# Patient Record
Sex: Female | Born: 1950 | Race: Black or African American | Hispanic: No | Smoking: Never smoker
Health system: Southern US, Community
[De-identification: ages and names within clinical notes are randomized; demographics above are authoritative.]

## PROBLEM LIST (undated history)

## (undated) DIAGNOSIS — E7800 Pure hypercholesterolemia, unspecified: Secondary | ICD-10-CM

## (undated) DIAGNOSIS — R011 Cardiac murmur, unspecified: Secondary | ICD-10-CM

## (undated) DIAGNOSIS — C439 Malignant melanoma of skin, unspecified: Secondary | ICD-10-CM

## (undated) DIAGNOSIS — Z9889 Other specified postprocedural states: Secondary | ICD-10-CM

## (undated) DIAGNOSIS — R112 Nausea with vomiting, unspecified: Secondary | ICD-10-CM

## (undated) DIAGNOSIS — I8290 Acute embolism and thrombosis of unspecified vein: Secondary | ICD-10-CM

## (undated) DIAGNOSIS — C50912 Malignant neoplasm of unspecified site of left female breast: Secondary | ICD-10-CM

## (undated) DIAGNOSIS — I10 Essential (primary) hypertension: Secondary | ICD-10-CM

## (undated) DIAGNOSIS — Z889 Allergy status to unspecified drugs, medicaments and biological substances status: Secondary | ICD-10-CM

## (undated) HISTORY — PX: MELANOMA EXCISION: SHX5266

## (undated) HISTORY — DX: Allergy status to unspecified drugs, medicaments and biological substances: Z88.9

## (undated) HISTORY — DX: Essential (primary) hypertension: I10

## (undated) HISTORY — DX: Acute embolism and thrombosis of unspecified vein: I82.90

## (undated) HISTORY — PX: PORTA CATH INSERTION: CATH118285

## (undated) HISTORY — PX: BUNIONECTOMY: SHX129

## (undated) HISTORY — PX: TUBAL LIGATION: SHX77

## (undated) HISTORY — PX: BREAST CYST EXCISION: SHX579

---

## 2003-04-20 DIAGNOSIS — I059 Rheumatic mitral valve disease, unspecified: Secondary | ICD-10-CM | POA: Insufficient documentation

## 2004-02-16 DIAGNOSIS — S22009A Unspecified fracture of unspecified thoracic vertebra, initial encounter for closed fracture: Secondary | ICD-10-CM | POA: Insufficient documentation

## 2005-06-09 ENCOUNTER — Encounter: Payer: Self-pay | Admitting: Hematology

## 2005-06-11 DIAGNOSIS — N6019 Diffuse cystic mastopathy of unspecified breast: Secondary | ICD-10-CM | POA: Insufficient documentation

## 2008-09-07 DIAGNOSIS — E559 Vitamin D deficiency, unspecified: Secondary | ICD-10-CM | POA: Insufficient documentation

## 2009-07-21 ENCOUNTER — Encounter: Payer: Self-pay | Admitting: Hematology

## 2009-09-08 DIAGNOSIS — Z803 Family history of malignant neoplasm of breast: Secondary | ICD-10-CM | POA: Insufficient documentation

## 2010-08-17 ENCOUNTER — Encounter: Payer: Self-pay | Admitting: Hematology

## 2012-09-03 DIAGNOSIS — Z7189 Other specified counseling: Secondary | ICD-10-CM | POA: Insufficient documentation

## 2013-07-28 ENCOUNTER — Encounter: Payer: Self-pay | Admitting: Hematology

## 2013-07-29 DIAGNOSIS — D122 Benign neoplasm of ascending colon: Secondary | ICD-10-CM | POA: Insufficient documentation

## 2013-07-29 DIAGNOSIS — K648 Other hemorrhoids: Secondary | ICD-10-CM | POA: Insufficient documentation

## 2013-08-15 DIAGNOSIS — R7303 Prediabetes: Secondary | ICD-10-CM | POA: Insufficient documentation

## 2015-02-08 DIAGNOSIS — D539 Nutritional anemia, unspecified: Secondary | ICD-10-CM | POA: Insufficient documentation

## 2016-01-25 DIAGNOSIS — R739 Hyperglycemia, unspecified: Secondary | ICD-10-CM | POA: Insufficient documentation

## 2017-09-11 DIAGNOSIS — C50912 Malignant neoplasm of unspecified site of left female breast: Secondary | ICD-10-CM

## 2017-09-11 HISTORY — DX: Malignant neoplasm of unspecified site of left female breast: C50.912

## 2017-09-11 HISTORY — PX: BREAST BIOPSY: SHX20

## 2017-09-26 ENCOUNTER — Encounter: Payer: Self-pay | Admitting: Hematology

## 2017-09-28 DIAGNOSIS — C50419 Malignant neoplasm of upper-outer quadrant of unspecified female breast: Secondary | ICD-10-CM | POA: Insufficient documentation

## 2017-10-11 ENCOUNTER — Encounter: Payer: Self-pay | Admitting: Hematology

## 2017-10-18 ENCOUNTER — Encounter: Payer: Self-pay | Admitting: Hematology

## 2017-10-29 ENCOUNTER — Encounter: Payer: Self-pay | Admitting: Hematology

## 2017-11-07 ENCOUNTER — Encounter: Payer: Self-pay | Admitting: Hematology

## 2017-11-20 ENCOUNTER — Other Ambulatory Visit (HOSPITAL_COMMUNITY): Payer: Self-pay | Admitting: Hematology

## 2017-11-23 ENCOUNTER — Other Ambulatory Visit (HOSPITAL_COMMUNITY): Payer: Self-pay | Admitting: Pharmacist

## 2017-11-25 DIAGNOSIS — R768 Other specified abnormal immunological findings in serum: Secondary | ICD-10-CM | POA: Insufficient documentation

## 2017-11-28 ENCOUNTER — Other Ambulatory Visit (HOSPITAL_COMMUNITY): Payer: Self-pay | Admitting: Adult Health

## 2017-11-28 DIAGNOSIS — C50412 Malignant neoplasm of upper-outer quadrant of left female breast: Secondary | ICD-10-CM | POA: Insufficient documentation

## 2017-11-28 DIAGNOSIS — Z171 Estrogen receptor negative status [ER-]: Principal | ICD-10-CM

## 2017-11-28 NOTE — Progress Notes (Deleted)
START ON PATHWAY REGIMEN - Breast     A cycle is every 14 days (cycles 1-4):     Doxorubicin      Cyclophosphamide      Pegfilgrastim-xxxx    A cycle is every 21 days (cycles 5-8):     Paclitaxel      Carboplatin   **Always confirm dose/schedule in your pharmacy ordering system**    Patient Characteristics: Preoperative or Nonsurgical Candidate (Clinical Staging), Neoadjuvant Therapy followed by Surgery, Invasive Disease, Chemotherapy, HER2 Negative/Unknown/Equivocal, ER Negative/Unknown, Platinum Therapy Indicated Therapeutic Status: Preoperative or Nonsurgical Candidate (Clinical Staging) AJCC M Category: cM0 AJCC Grade: G3 Breast Surgical Plan: Neoadjuvant Therapy followed by Surgery ER Status: Negative (-) AJCC 8 Stage Grouping: IIIC HER2 Status: Negative (-) AJCC T Category: cT1c AJCC N Category: cN2 PR Status: Negative (-) Type of Therapy: Platinum Therapy Indicated Intent of Therapy: Curative Intent, Discussed with Patient

## 2017-11-29 ENCOUNTER — Other Ambulatory Visit (HOSPITAL_COMMUNITY): Payer: Self-pay | Admitting: Pharmacist

## 2017-11-29 ENCOUNTER — Encounter (HOSPITAL_COMMUNITY): Payer: Self-pay | Admitting: Emergency Medicine

## 2017-11-29 NOTE — Patient Instructions (Signed)
[facility]   CHEMOTHERAPY INSTRUCTIONS   You will have the following premedications prior to receiving chemotherapy: Premeds: Benadryl:  Help prevent a reaction to the chemotherapy.  Pepcid: antihistamine premed Aloxi - high powered nausea/vomiting prevention medication used for chemotherapy patients.  Dexamethasone - steroid - given to reduce the risk of you having an allergic type reaction to the chemotherapy. Dex can cause you to feel energized, nervous/anxious/jittery, make you have trouble sleeping, and/or make you feel hot/flushed in the face/neck and/or look pink/red in the face/neck. These side effects will pass as the Dex wears off. (takes 20 minutes to infuse)   POTENTIAL SIDE EFFECTS OF TREATMENT:  Carboplatin (Generic Name) Other Names: Paraplatin, CBDCA  About This Drug Carboplatin is a drug used to treat cancer. This drug is given in the vein (IV). This drug takes about 30 minutes to infuse.   Possible Side Effects (More Common) . Nausea and throwing up (vomiting). These symptoms may happen within a few hours after your treatment and may last up to 24 hours. Medicines are available to stop or lessen these side effects. . Bone marrow depression. This is a decrease in the number of white blood cells, red blood cells, and platelets. This may raise your risk of infection, make you tired and weak (fatigue), and raise your risk of bleeding. . Soreness of the mouth and throat. You may have red areas, white patches, or sores that hurt. . This drug may affect how your kidneys work. Your kidney function will be checked as needed. . Electrolyte changes. Your blood will be checked for electrolyte changes as needed.  Side Effects (Less Common) . Hair loss. Some patients lose their hair on the scalp and body. You may notice your hair thinning seven to 14 days after getting this drug. . Effects on the nerves are called peripheral neuropathy. You may feel  numbness, tingling, or pain in your hands and feet. It may be hard for you to button your clothes, open jars, or walk as usual. The effect on the nerves may get worse with more doses of the drug. These effects get better in some people after the drug is stopped but it does not get better in all people. . Loose bowel movements (diarrhea) that may last for several days . Decreased hearing or ringing in the ears . Changes in the way food and drinks taste . Changes in liver function. Your liver function will be checked as needed.  Allergic Reactions Serious allergic reactions including anaphylaxis are rare. While you are getting this drug in your vein (IV), tell your nurse right away if you have any of these symptoms of an allergic reaction: . Trouble catching your breath . Feeling like your tongue or throat are swelling . Feeling your heart beat quickly or in a not normal way (palpitations) . Feeling dizzy or lightheaded . Flushing, itching, rash, and/or hives  Treating Side Effects . Drink 6-8 cups of fluids each day unless your doctor has told you to limit your fluid intake due to some other health problem. A cup is 8 ounces of fluid. If you throw up or have loose bowel movements, you should drink more fluids so that you do not become dehydrated (lack water in the body from losing too much fluid). . Mouth care is very important. Your mouth care should consist of routine, gentle cleaning of your teeth or dentures and rinsing your mouth with a mixture of 1/2 teaspoon of salt  in 8 ounces of water or  teaspoon of baking soda in 8 ounces of water. This should be done at least after each meal and at bedtime. . If you have mouth sores, avoid mouthwash that has alcohol. Avoid alcohol and smoking because they can bother your mouth and throat. . If you have numbness and tingling in your hands and feet, be careful when cooking, walking, and handling sharp objects and hot liquids. . Talk with your nurse about  getting a wig before you lose your hair. Also, call the [facility] at 800-ACS-2345 to find out information about the "Look Good, Feel Better" program close to where you live. It is a free program where women getting chemotherapy can learn about wigs, turbans and scarves as well as makeup techniques and skin and nail care.  Food and Drug Interactions There are no known interactions of carboplatin with food. This drug may interact with other medicines. Tell your doctor and pharmacist about all the medicines and dietary supplements (vitamins, minerals, herbs and others) that you are taking at this time. The safety and use of dietary supplements and alternative diets are often not known. Using these might affect your cancer or interfere with your treatment. Until more is known, you should not use dietary supplements or alternative diets without your doctor's help.  When to Call the Doctor Call your doctor or nurse right away if you have any of these symptoms: . Fever of 100.5 F (38 C) or above; chills . Bleeding or bruising that is not normal . Wheezing or trouble breathing . Nausea that stops you from eating or drinking . Throwing up more than once a day . Rash or itching . Loose bowel movements (diarrhea) more than four times a day or diarrhea with weakness or feeling lightheaded . Call your doctor or nurse as soon as possible if any of these symptoms happen: . Numbness, tingling, decreased feeling or weakness in fingers, toes, arms, or legs . Change in hearing, ringing in the ears . Blurred vision or other changes in eyesight . Decreased urine . Yellowing of skin or eyes  Problems and Reproductive Concerns Sexual problems and reproduction concerns may happen. In both men and women, this drug may affect your ability to have children. This cannot be determined before your treatment. Talk with your doctor or nurse if you plan to have children. Ask for information on sperm or egg  banking. In men, this drug may interfere with your ability to make sperm, but it should not change your ability to have sexual relations. In women, menstrual bleeding may become irregular or stop while you are getting this drug. Do not assume that you cannot become pregnant if you do not have a menstrual period. Women may go through signs of menopause (change of life) like vaginal dryness or itching. Vaginal lubricants can be used to lessen vaginal dryness, itching, and pain during sexual relations. Genetic counseling is available for you to talk about the effects of this drug therapy on future pregnancies. Also, a genetic counselor can look at the possible risk of problems in the unborn baby due to this medicine if an exposure happens during pregnancy. . Pregnancy warning: This drug may have harmful effects on the unborn child, so effective methods of birth control should be used during your cancer treatment. . Breast feeding warning: It is not known if this drug passes into breast milk. For this reason, women should talk to their doctor about the risks and benefits  of breast feeding during treatment with this drug because this drug may enter the breast milk and badly harm a breast feeding baby.   Paclitaxel (Taxol)  Paclitaxel is a drug used to treat cancer. It is given in the vein (IV).  This will take 3 hours to infuse.  The first time this drug is given it will take longer to infuse because it is increased slowly to monitor for reactions.  The nurse will be in the room with you for the first 15 minutes.   Possible Side Effects . Hair loss. Hair loss is often temporary, although with certain medicine, hair loss can sometimes be permanent. Hair loss may happen suddenly or gradually. If you lose hair, you may lose it from your head, face, armpits, pubic area, chest, and/or legs. You may also notice your hair getting thin. . Swelling of your legs, ankles and/or feet (edema) . Flushing . Nausea and  throwing up (vomiting) . Loose bowel movements (diarrhea) . Bone marrow depression. This is a decrease in the number of white blood cells, red blood cells, and platelets. This may raise your risk of infection, make you tired and weak (fatigue), and raise your risk of bleeding. . Effects on the nerves are called peripheral neuropathy. You may feel numbness, tingling, or pain in your hands and feet. It may be hard for you to button your clothes, open jars, or walk as usual. The effect on the nerves may get worse with more doses of the drug. These effects get better in some people after the drug is stopped but it does not get better in all people. . Changes in your liver function . Bone, joint and muscle pain . Abnormal EKG . Allergic reaction: Allergic reactions, including anaphylaxis are rare but may happen in some patients. Signs of allergic reaction to this drug may be swelling of the face, feeling like your tongue or throat are swelling, trouble breathing, rash, itching, fever, chills, feeling dizzy, and/or feeling that your heart is beating in a fast or not normal way. If this happens, do not take another dose of this drug. You should get urgent medical treatment. . Infection . Changes in your kidney function. Note: Each of the side effects above was reported in 20% or greater of patients treated with paclitaxel. Not all possible side effects are included above.  Warnings and Precautions . Severe bone marrow depression   Side Effects . To help with hair loss, wash with a mild shampoo and avoid washing your hair every day. . Avoid rubbing your scalp, instead, pat your hair or scalp dry . Avoid coloring your hair . Limit your use of hair spray, electric curlers, blow dryers, and curling irons. . If you are interested in getting a wig, talk to your nurse. You can also call the [facility] at 800-ACS-2345 to find out information about the "Look Good, Feel Better" program close to  where you live. It is a free program where women getting chemotherapy can learn about wigs, turbans and scarves as well as makeup techniques and skin and nail care. . Ask your doctor or nurse about medicines that are available to help stop or lessen diarrhea and/or nausea. . To help with nausea and vomiting, eat small, frequent meals instead of three large meals a day. Choose foods and drinks that are at room temperature. Ask your nurse or doctor about other helpful tips and medicine that is available to help or stop lessen these symptoms. . If  you get diarrhea, eat low-fiber foods that are high in protein and calories and avoid foods that can irritate your digestive tracts or lead to cramping. Ask your nurse or doctor about medicine that can lessen or stop your diarrhea. . Mouth care is very important. Your mouth care should consist of routine, gentle cleaning of your teeth or dentures and rinsing your mouth with a mixture of 1/2 teaspoon of salt in 8 ounces of water or  teaspoon of baking soda in 8 ounces of water. This should be done at least after each meal and at bedtime. . If you have mouth sores, avoid mouthwash that has alcohol. Also avoid alcohol and smoking because they can bother your mouth and throat. . Drink plenty of fluids (a minimum of eight glasses per day is recommended). . Take your temperature as your doctor or nurse tells you, and whenever you feel like you may have a fever. . Talk to your doctor or nurse about precautions you can take to avoid infections and bleeding. . Be careful when cooking, walking, and handling sharp objects and hot liquids.  Food and Drug Interactions . There are no known interactions of paclitaxel with food. . This drug may interact with other medicines. Tell your doctor and pharmacist about all the medicines and dietary supplements (vitamins, minerals, herbs and others) that you are taking at this time. . The safety and use of dietary supplements and  alternative diets are often not known. Using these might affect your cancer or interfere with your treatment. Until more is known, you should not use dietary supplements or alternative diets without your cancer doctor's help.  When to Call the Doctor Call your doctor or nurse if you have any of the following symptoms and/or any new or unusual symptoms: . Fever of 100.5 F (38 C) or above . Chills . Redness, pain, warmth, or swelling at the IV site during the infusion . Signs of allergic reaction: swelling of the face, feeling like your tongue or throat are swelling, trouble breathing, rash, itching, fever, chills, feeling dizzy, and/or feeling that your heart is beating in a fast or not normal way . Feeling that your heart is beating in a fast or not normal way (palpitations) . Weight gain of 5 pounds in one week (fluid retention) . Decreased urine or very dark urine . Signs of liver problems: dark urine, pale bowel movements, bad stomach pain, feeling very tired and weak, unusual itching, or yellowing of the eyes or skin . Heavy menstrual period that lasts longer than normal . Easy bruising or bleeding . Nausea that stops you from eating or drinking, and/or that is not relieved by prescribed medicines. . Loose bowel movements (diarrhea) more than 4 times a day or diarrhea with weakness or lightheadedness . Pain in your mouth or throat that makes it hard to eat or drink . Lasting loss of appetite or rapid weight loss of five pounds in a week . Signs of peripheral neuropathy: numbness, tingling, or decreased feeling in fingers or toes; trouble walking or changes in the way you walk; or feeling clumsy when buttoning clothes, opening jars, or other routine activities . Joint and muscle pain that is not relieved by prescribed medicines . Extreme fatigue that interferes with normal activities . While you are getting this drug, please tell your nurse right away if you have any pain, redness, or  swelling at the site of the IV infusion. . If you think you are pregnant.  Reproduction  Warnings . Pregnancy warning: This drug may have harmful effects on the unborn child, it is recommended that effective methods of birth control should be used during your cancer treatment. Let your doctor know right away if you think you may be pregnant. . Breast feeding warning: Women should not breast feed during treatment because this drug could enter the breast milk and cause harm to a breast feeding baby.     SELF CARE ACTIVITIES WHILE ON CHEMOTHERAPY: Hydration Increase your fluid intake 48 hours prior to treatment and drink at least 8 to 12 cups (64 ounces) of water/decaff beverages per day after treatment. You can still have your cup of coffee or soda but these beverages do not count as part of your 8 to 12 cups that you need to drink daily. No alcohol intake.  Medications Continue taking your normal prescription medication as prescribed.  If you start any new herbal or new supplements please let us know first to make sure it is safe.  Mouth Care Have teeth cleaned professionally before starting treatment. Keep dentures and partial plates clean. Use soft toothbrush and do not use mouthwashes that contain alcohol. Biotene is a good mouthwash that is available at most pharmacies or may be ordered by calling [phone]. Use warm salt water gargles (1 teaspoon salt per 1 quart warm water) before and after meals and at bedtime. Or you may rinse with 2 tablespoons of three-percent hydrogen peroxide mixed in eight ounces of water. If you are still having problems with your mouth or sores in your mouth please call the clinic. If you need dental work, please let the doctor know before you go for your appointment so that we can coordinate the best possible time for you in regards to your chemo regimen. You need to also let your dentist know that you are actively taking chemo. We may need to do labs prior to  your dental appointment.  Skin Care Always use sunscreen that has not expired and with SPF (Sun Protection Factor) of 50 or higher. Wear hats to protect your head from the sun. Remember to use sunscreen on your hands, ears, face, & feet.  Use good moisturizing lotions such as udder cream, eucerin, or even Vaseline. Some chemotherapies can cause dry skin, color changes in your skin and nails.    . Avoid long, hot showers or baths. . Use gentle, fragrance-free soaps and laundry detergent. . Use moisturizers, preferably creams or ointments rather than lotions because the thicker consistency is better at preventing skin dehydration. Apply the cream or ointment within 15 minutes of showering. Reapply moisturizer at night, and moisturize your hands every time after you wash them.  Hair Loss (if your doctor says your hair will fall out)  . If your doctor says that your hair is likely to fall out, decide before you begin chemo whether you want to wear a wig. You may want to shop before treatment to match your hair color. . Hats, turbans, and scarves can also camouflage hair loss, although some people prefer to leave their heads uncovered. If you go bare-headed outdoors, be sure to use sunscreen on your scalp. . Cut your hair short. It eases the inconvenience of shedding lots of hair, but it also can reduce the emotional impact of watching your hair fall out. . Don't perm or color your hair during chemotherapy. Those chemical treatments are already damaging to hair and can enhance hair loss. Once your chemo treatments are done and your hair  has grown back, it's OK to resume dyeing or perming hair. With chemotherapy, hair loss is almost always temporary. But when it grows back, it may be a different color or texture. In older adults who still had hair color before chemotherapy, the new growth may be completely gray.  Often, new hair is very fine and soft.  Infection Prevention Please wash your hands for at  least 30 seconds using warm soapy water. Handwashing is the #1 way to prevent the spread of germs. Stay away from sick people or people who are getting over a cold. If you develop respiratory systems such as green/yellow mucus production or productive cough or persistent cough let us know and we will see if you need an antibiotic. It is a good idea to keep a pair of gloves on when going into grocery stores/[employer] to decrease your risk of coming into contact with germs on the carts, etc. Carry alcohol hand gel with you at all times and use it frequently if out in public. If your temperature reaches 100.5 or higher please call the clinic and let us know.  If it is after hours or on the weekend please go to the ER if your temperature is over 100.5.  Please have your own personal thermometer at home to use.    Sex and bodily fluids If you are going to have sex, a condom must be used to protect the person that isn't taking chemotherapy. Chemo can decrease your libido (sex drive). For a few days after chemotherapy, chemotherapy can be excreted through your bodily fluids.  When using the toilet please close the lid and flush the toilet twice.  Do this for a few day after you have had chemotherapy.   Effects of chemotherapy on your sex life Some changes are simple and won't last long. They won't affect your sex life permanently. Sometimes you may feel: . too tired . not strong enough to be very active . sick or sore  . not in the mood . anxious or low Your anxiety might not seem related to sex. For example, you may be worried about the cancer and how your treatment is going. Or you may be worried about money, or about how you family are coping with your illness. These things can cause stress, which can affect your interest in sex. It's important to talk to your partner about how you feel. Remember - the changes to your sex life don't usually last long. There's usually no medical reason to stop having sex  during chemo. The drugs won't have any long term physical effects on your performance or enjoyment of sex. Cancer can't be passed on to your partner during sex  Contraception It's important to use reliable contraception during treatment. Avoid getting pregnant while you or your partner are having chemotherapy. This is because the drugs may harm the baby. Sometimes chemotherapy drugs can leave a man or woman infertile.  This means you would not be able to have children in the future. You might want to talk to someone about permanent infertility. It can be very difficult to learn that you may no longer be able to have children. Some people find counselling helpful. There might be ways to preserve your fertility, although this is easier for men than for women. You may want to speak to a fertility expert. You can talk about sperm banking or harvesting your eggs. You can also ask about other fertility options, such as donor eggs. If you have or  have had breast cancer, your doctor might advise you not to take the contraceptive pill. This is because the hormones in it might affect the cancer.  It is not known for sure whether or not chemotherapy drugs can be passed on through semen or secretions from the vagina. Because of this some doctors advise people to use a barrier method if you have sex during treatment. This applies to vaginal, anal or oral sex. Generally, doctors advise a barrier method only for the time you are actually having the treatment and for about a week after your treatment. Advice like this can be worrying, but this does not mean that you have to avoid being intimate with your partner. You can still have close contact with your partner and continue to enjoy sex.     Animals If you have cats or birds we just ask that you not change the litter or change the cage.  Please have someone else do this for you while you are on chemotherapy.   Food Safety During and After Cancer Treatment Food  safety is important for people both during and after cancer treatment. Cancer and cancer treatments, such as chemotherapy, radiation therapy, and stem cell/bone marrow transplantation, often weaken the immune system. This makes it harder for your body to protect itself from foodborne illness, also called food poisoning. Foodborne illness is caused by eating food that contains harmful bacteria, parasites, or viruses.  Foods to avoid Some foods have a higher risk of becoming tainted with bacteria. These include: [place] Kitchen Unwashed fresh fruit and vegetables, especially leafy vegetables that can hide dirt and other contaminants . Raw sprouts, such as alfalfa sprouts . Raw or undercooked beef, especially ground beef, or other raw or undercooked meat and poultry . Fatty, fried, or spicy foods immediately before or after treatment.  These can sit heavy on your stomach and make you feel nauseous. . Raw or undercooked shellfish, such as oysters. . Sushi and sashimi, which often contain raw fish.  . Unpasteurized beverages, such as unpasteurized fruit juices, raw milk, raw yogurt, or cider . Undercooked eggs, such as soft boiled, over easy, and poached; raw, unpasteurized eggs; or foods made with raw egg, such as homemade raw cookie dough and homemade mayonnaise Simple steps for food safety Shop smart. . Do not buy food stored or displayed in an unclean area. . Do not buy bruised or damaged fruits or vegetables. . Do not buy cans that have cracks, dents, or bulges. . Pick up foods that can spoil at the end of your shopping trip and store them in a cooler on the way home. Prepare and clean up foods carefully. . Rinse all fresh fruits and vegetables under running water, and dry them with a clean towel or paper towel. . Clean the top of cans before opening them. . After preparing food, wash your hands for 20 seconds with hot water and soap. Pay special attention to areas between fingers and under nails. . Clean  your utensils and dishes with hot water and soap. [place] Kitchen Disinfect your kitchen and cutting boards using 1 teaspoon of liquid, unscented bleach mixed into 1 quart of water.   Dispose of old food. . Eat canned and packaged food before its expiration date (the "use by" or "best before" date). . Consume refrigerated leftovers within 3 to 4 days. After that time, throw out the food. Even if the food does not smell or look spoiled, it still may be unsafe. Some bacteria, such as Listeria,  can grow even on foods stored in the refrigerator if they are kept for too long. Take precautions when eating out. . At restaurants, avoid buffets and salad bars where food sits out for a long time and comes in contact with many people. Food can become contaminated when someone with a virus, often a norovirus, or another "bug" handles it. . Put any leftover food in a "to-go" container yourself, rather than having the server do it. And, refrigerate leftovers as soon as you get home. . Choose restaurants that are clean and that are willing to prepare your food as you order it cooked.    MEDICATIONS:                                                                                                                    Zofran/Ondansetron 8mg  tablet. Take 1 tablet every 8 hours as needed for nausea/vomiting. (#1 nausea med to take, this can constipate)  Compazine/Prochlorperazine 10mg  tablet. Take 1 tablet every 6 hours as needed for nausea/vomiting. (#2 nausea med to take, this can make you sleepy) EMLA cream. Apply a quarter size amount to port site 1 hour prior to chemo. Do not rub in. Cover with plastic wrap.   Over-the-Counter Meds: Miralax 1 capful in 8 oz of fluid daily. May increase to two times a day if needed. This is a stool softener. If this doesn't work proceed you can add:  Senokot S-start with 1 tablet two times a day and increase to 4 tablets two times a day if needed. (total of 8 tablets in a 24 hour period). This  is a stimulant laxative.   Call us if this does not help your bowels move.   Imodium 2mg  capsule. Take 2 capsules after the 1st loose stool and then 1 capsule every 2 hours until you go a total of 12 hours without having a loose stool. Call the [facility] if loose stools continue. If diarrhea occurs @ bedtime, take 2 capsules @ bedtime. Then take 2 capsules every 4 hours until morning. Call [facility].    Diarrhea Sheet  If you are having loose stools/diarrhea, please purchase Imodium and begin taking as outlined:  At the first sign of poorly formed or loose stools you should begin taking Imodium(loperamide) 2 mg capsules.  Take two caplets (4mg ) followed by one caplet (2mg ) every 2 hours until you have had no diarrhea for 12 hours.  During the night take two caplets (4mg ) at bedtime and continue every 4 hours during the night until the morning.  Stop taking Imodium only after there is no sign of diarrhea for 12 hours.    Always call the [facility] if you are having loose stools/diarrhea that you can't get under control.  Loose stools/diarrhea leads to dehydration (loss of water) in your body.  We have other options of trying to get the loose stools/diarrhea to stopped but you must let us know!   Constipation Sheet *Miralax in 8 oz of fluid daily.  May increase to two times a day if needed.  This is a stool softener.  If this not enough to keep your bowel regular:  You can add:  *Senokot S, start with one tablet twice a day and can increase to 4 tablets twice a day if needed.  This is a stimulant laxative.   Sometimes when you take pain medication you need BOTH a medicine to keep your stool soft and a medicine to help your bowel push it out!  Please call if the above does not work for you.   Do not go more than 2 days without a bowel movement.  It is very important that you do not become constipated.  It will make you feel sick to your stomach (nausea) and can cause abdominal pain  and vomiting.   Nausea Sheet  Zofran/Ondansetron 8mg  tablet. Take 1 tablet every 8 hours as needed for nausea/vomiting. (#1 nausea med to take, this can constipate)  Compazine/Prochlorperazine 10mg  tablet. Take 1 tablet every 6 hours as needed for nausea/vomiting. (#2 nausea med to take, this can make you sleepy)  You can take these medications together or separately.  We would first like for you to try the Ondansetron by itself and then take the Prochloperizine if needed. But you are allowed to take both medications at the same time if your nausea is that severe.  If you are having persistent nausea (nausea that does not stop) please take these medications on a staggered schedule so that the nausea medication stays in your body.  Please call the [facility] and let us know the amount of nausea that you are experiencing.  If you begin to vomit, you need to call the [facility] and if it is the weekend and you have vomited more than one time and cant get it to stop-go to the Emergency Room.  Persistent nausea/vomiting can lead to dehydration (loss of fluid in your body) and will make you feel terrible.   Ice chips, sips of clear liquids, foods that are @ room temperature, crackers, and toast tend to be better tolerated.     SYMPTOMS TO REPORT AS SOON AS POSSIBLE AFTER TREATMENT:  FEVER GREATER THAN 100.5 F  CHILLS WITH OR WITHOUT FEVER  NAUSEA AND VOMITING THAT IS NOT CONTROLLED WITH YOUR NAUSEA MEDICATION  UNUSUAL SHORTNESS OF BREATH  UNUSUAL BRUISING OR BLEEDING  TENDERNESS IN MOUTH AND THROAT WITH OR WITHOUT PRESENCE OF ULCERS  URINARY PROBLEMS  BOWEL PROBLEMS  UNUSUAL RASH      Wear comfortable clothing and clothing appropriate for easy access to any Portacath or PICC line. Let us know if there is anything that we can do to make your therapy better!         What to do if you need assistance after hours or on the weekends: CALL [phone].  HOLD on the line, do  not hang up.  You will hear multiple messages but at the end you will be connected with a nurse triage line.  They will contact the doctor if necessary.  Most of the time they will be able to assist you.  Do not call the hospital operator.    I have been informed and understand all of the instructions given to me and have received a copy. I have been instructed to call the clinic [phone] or my family physician as soon as possible for continued medical care, if indicated. I do not have any more questions at this time but understand  that I may call the [facility] or the Patient Navigator at [phone] during office hours should I have questions or need assistance in obtaining follow-up care.

## 2017-11-29 NOTE — Progress Notes (Signed)
Chemotherapy teaching pulled together. 

## 2017-12-05 ENCOUNTER — Inpatient Hospital Stay (HOSPITAL_COMMUNITY): Payer: Managed Care, Other (non HMO)

## 2017-12-05 ENCOUNTER — Encounter (HOSPITAL_COMMUNITY): Payer: Self-pay | Admitting: Hematology

## 2017-12-05 ENCOUNTER — Inpatient Hospital Stay (HOSPITAL_COMMUNITY): Payer: Managed Care, Other (non HMO) | Attending: Hematology | Admitting: Hematology

## 2017-12-05 ENCOUNTER — Other Ambulatory Visit: Payer: Self-pay

## 2017-12-05 VITALS — BP 131/71 | HR 88 | Temp 98.8°F | Resp 18 | Ht 64.0 in | Wt 185.6 lb

## 2017-12-05 DIAGNOSIS — Z85820 Personal history of malignant melanoma of skin: Secondary | ICD-10-CM | POA: Diagnosis not present

## 2017-12-05 DIAGNOSIS — I10 Essential (primary) hypertension: Secondary | ICD-10-CM | POA: Insufficient documentation

## 2017-12-05 DIAGNOSIS — C50412 Malignant neoplasm of upper-outer quadrant of left female breast: Secondary | ICD-10-CM | POA: Diagnosis present

## 2017-12-05 DIAGNOSIS — Z5111 Encounter for antineoplastic chemotherapy: Secondary | ICD-10-CM | POA: Insufficient documentation

## 2017-12-05 DIAGNOSIS — Z79899 Other long term (current) drug therapy: Secondary | ICD-10-CM | POA: Insufficient documentation

## 2017-12-05 DIAGNOSIS — C50922 Malignant neoplasm of unspecified site of left male breast: Secondary | ICD-10-CM | POA: Diagnosis not present

## 2017-12-05 DIAGNOSIS — Z7689 Persons encountering health services in other specified circumstances: Secondary | ICD-10-CM | POA: Insufficient documentation

## 2017-12-05 DIAGNOSIS — E7800 Pure hypercholesterolemia, unspecified: Secondary | ICD-10-CM | POA: Insufficient documentation

## 2017-12-05 DIAGNOSIS — Z171 Estrogen receptor negative status [ER-]: Principal | ICD-10-CM

## 2017-12-05 DIAGNOSIS — Z86718 Personal history of other venous thrombosis and embolism: Secondary | ICD-10-CM | POA: Insufficient documentation

## 2017-12-05 DIAGNOSIS — Z803 Family history of malignant neoplasm of breast: Secondary | ICD-10-CM | POA: Diagnosis not present

## 2017-12-05 DIAGNOSIS — Z8042 Family history of malignant neoplasm of prostate: Secondary | ICD-10-CM

## 2017-12-05 LAB — CBC WITH DIFFERENTIAL/PLATELET
BASOS ABS: 0 10*3/uL (ref 0.0–0.1)
Basophils Relative: 0 %
EOS PCT: 1 %
Eosinophils Absolute: 0.1 10*3/uL (ref 0.0–0.7)
HEMATOCRIT: 38.1 % — AB (ref 39.0–52.0)
Hemoglobin: 12.2 g/dL — ABNORMAL LOW (ref 13.0–17.0)
LYMPHS ABS: 0.8 10*3/uL (ref 0.7–4.0)
LYMPHS PCT: 10 %
MCH: 27.1 pg (ref 26.0–34.0)
MCHC: 32 g/dL (ref 30.0–36.0)
MCV: 84.7 fL (ref 78.0–100.0)
Monocytes Absolute: 0.7 10*3/uL (ref 0.1–1.0)
Monocytes Relative: 9 %
NEUTROS ABS: 5.9 10*3/uL (ref 1.7–7.7)
Neutrophils Relative %: 80 %
PLATELETS: 235 10*3/uL (ref 150–400)
RBC: 4.5 MIL/uL (ref 4.22–5.81)
RDW: 14.7 % (ref 11.5–15.5)
WBC: 7.5 10*3/uL (ref 4.0–10.5)

## 2017-12-05 LAB — COMPREHENSIVE METABOLIC PANEL
ALT: 40 U/L (ref 17–63)
AST: 25 U/L (ref 15–41)
Albumin: 4.2 g/dL (ref 3.5–5.0)
Alkaline Phosphatase: 117 U/L (ref 38–126)
Anion gap: 13 (ref 5–15)
BILIRUBIN TOTAL: 0.9 mg/dL (ref 0.3–1.2)
BUN: 14 mg/dL (ref 6–20)
CHLORIDE: 101 mmol/L (ref 101–111)
CO2: 24 mmol/L (ref 22–32)
CREATININE: 0.63 mg/dL (ref 0.61–1.24)
Calcium: 9.9 mg/dL (ref 8.9–10.3)
Glucose, Bld: 151 mg/dL — ABNORMAL HIGH (ref 65–99)
Potassium: 3.3 mmol/L — ABNORMAL LOW (ref 3.5–5.1)
Sodium: 138 mmol/L (ref 135–145)
TOTAL PROTEIN: 7.4 g/dL (ref 6.5–8.1)

## 2017-12-05 MED ORDER — DOXORUBICIN HCL CHEMO IV INJECTION 2 MG/ML
60.0000 mg/m2 | Freq: Once | INTRAVENOUS | Status: AC
  Administered 2017-12-05: 114 mg via INTRAVENOUS
  Filled 2017-12-05: qty 57

## 2017-12-05 MED ORDER — HEPARIN SOD (PORK) LOCK FLUSH 100 UNIT/ML IV SOLN
500.0000 [IU] | Freq: Once | INTRAVENOUS | Status: AC | PRN
  Administered 2017-12-05: 500 [IU]
  Filled 2017-12-05 (×2): qty 5

## 2017-12-05 MED ORDER — SODIUM CHLORIDE 0.9% FLUSH
10.0000 mL | INTRAVENOUS | Status: DC | PRN
  Administered 2017-12-05: 10 mL
  Filled 2017-12-05: qty 10

## 2017-12-05 MED ORDER — PEGFILGRASTIM 6 MG/0.6ML ~~LOC~~ PSKT
6.0000 mg | PREFILLED_SYRINGE | Freq: Once | SUBCUTANEOUS | Status: AC
  Administered 2017-12-05: 6 mg via SUBCUTANEOUS

## 2017-12-05 MED ORDER — SODIUM CHLORIDE 0.9 % IV SOLN
Freq: Once | INTRAVENOUS | Status: AC
  Administered 2017-12-05: 10:00:00 via INTRAVENOUS

## 2017-12-05 MED ORDER — PEGFILGRASTIM 6 MG/0.6ML ~~LOC~~ PSKT
PREFILLED_SYRINGE | SUBCUTANEOUS | Status: AC
  Filled 2017-12-05: qty 0.6

## 2017-12-05 MED ORDER — PALONOSETRON HCL INJECTION 0.25 MG/5ML
INTRAVENOUS | Status: AC
Start: 2017-12-05 — End: ?
  Filled 2017-12-05: qty 5

## 2017-12-05 MED ORDER — SODIUM CHLORIDE 0.9 % IV SOLN
600.0000 mg/m2 | Freq: Once | INTRAVENOUS | Status: AC
  Administered 2017-12-05: 1140 mg via INTRAVENOUS
  Filled 2017-12-05: qty 50

## 2017-12-05 MED ORDER — PALONOSETRON HCL INJECTION 0.25 MG/5ML
0.2500 mg | Freq: Once | INTRAVENOUS | Status: AC
  Administered 2017-12-05: 0.25 mg via INTRAVENOUS

## 2017-12-05 MED ORDER — SODIUM CHLORIDE 0.9 % IV SOLN
Freq: Once | INTRAVENOUS | Status: AC
  Administered 2017-12-05: 10:00:00 via INTRAVENOUS
  Filled 2017-12-05: qty 5

## 2017-12-05 NOTE — Progress Notes (Signed)
[id number] Labs reviewed with and pt seen by Dr. Delton Coombes and pt approved for chemo tx today per MD.                                                                                 [facility] tolerated chemo tx with Neulasta on-pro well without complaints or incident. Neulasta on-pro applied to pt's abdomen with green indicator light flashing. VSS upon discharge. Pt discharged self ambulatory in satisfactory condition accompanied by family members

## 2017-12-05 NOTE — Patient Instructions (Signed)
[facility] Discharge Instructions  You were seen today by Dr. Katragadda    Thank you for choosing [facility] at [facility] to provide your oncology and hematology care.  To afford each patient quality time with our provider, please arrive at least 15 minutes before your scheduled appointment time.   If you have a lab appointment with the Cancer Center please come in thru the  Main Entrance and check in at the main information desk  You need to re-schedule your appointment should you arrive 10 or more minutes late.  We strive to give you quality time with our providers, and arriving late affects you and other patients whose appointments are after yours.  Also, if you no show three or more times for appointments you may be dismissed from the clinic at the providers discretion.     Again, thank you for choosing [facility].  Our hope is that these requests will decrease the amount of time that you wait before being seen by our physicians.       _____________________________________________________________  Should you have questions after your visit to [facility], please contact our office at [phone] between the hours of 8:30 a.m. and 4:30 p.m.  Voicemails left after 4:30 p.m. will not be returned until the following business day.  For prescription refill requests, have your pharmacy contact our office.       Resources For Cancer Patients and their Caregivers ? American Cancer Society: Can assist with transportation, wigs, general needs, runs Look Good Feel Better.        1-888-227-6333 ? Cancer Care: Provides financial assistance, online support groups, medication/co-pay assistance.  1-800-813-HOPE (4673) ? [facility] Assists [place] cancer patients and their families through emotional , educational and financial support.  336-427-4357 ? [place] DSS Where to  apply for food stamps, Medicaid and utility assistance. 336-342-1394 ? RCATS: Transportation to medical appointments. 336-347-2287 ? Social Security Administration: May apply for disability if have a Stage IV cancer. 336-342-7796 1-800-772-1213 ? [place] Aging, Disability and Transit Services: Assists with nutrition, care and transit needs. 336-349-2343  Cancer Center Support Programs:   > Cancer Support Group  2nd Tuesday of the month 1pm-2pm, Journey Room   > Creative Journey  3rd Tuesday of the month 1130am-1pm, Journey Room    

## 2017-12-05 NOTE — Patient Instructions (Signed)
[facility] Discharge Instructions for Patients Receiving Chemotherapy   Beginning January 23rd 2017 lab work for the Cancer Center will be done in the  Main lab at [facility] on 1st floor. If you have a lab appointment with the Cancer Center please come in thru the  Main Entrance and check in at the main information desk   Today you received the following chemotherapy agents Adriamycin and Cytoxan as well as Neulasta on-pro. Follow-up as scheduled. Call clinic for any questions or concerns  To help prevent nausea and vomiting after your treatment, we encourage you to take your nausea medication   If you develop nausea and vomiting, or diarrhea that is not controlled by your medication, call the clinic.  The clinic phone number is [phone]. Office hours are Monday-Friday 8:30am-5:00pm.  BELOW ARE SYMPTOMS THAT SHOULD BE REPORTED IMMEDIATELY:  *FEVER GREATER THAN 101.0 F  *CHILLS WITH OR WITHOUT FEVER  NAUSEA AND VOMITING THAT IS NOT CONTROLLED WITH YOUR NAUSEA MEDICATION  *UNUSUAL SHORTNESS OF BREATH  *UNUSUAL BRUISING OR BLEEDING  TENDERNESS IN MOUTH AND THROAT WITH OR WITHOUT PRESENCE OF ULCERS  *URINARY PROBLEMS  *BOWEL PROBLEMS  UNUSUAL RASH Items with * indicate a potential emergency and should be followed up as soon as possible. If you have an emergency after office hours please contact your primary care physician or go to the nearest emergency department.  Please call the clinic during office hours if you have any questions or concerns.   You may also contact the Patient Navigator at [phone] should you have any questions or need assistance in obtaining follow up care.      Resources For Cancer Patients and their Caregivers ? American Cancer Society: Can assist with transportation, wigs, general needs, runs Look Good Feel Better.        1-888-227-6333 ? Cancer Care: Provides financial assistance, online support groups,  medication/co-pay assistance.  1-800-813-HOPE (4673) ? [facility] Assists [place] cancer patients and their families through emotional , educational and financial support.  336-427-4357 ? [place] DSS Where to apply for food stamps, Medicaid and utility assistance. 336-342-1394 ? RCATS: Transportation to medical appointments. 336-347-2287 ? Social Security Administration: May apply for disability if have a Stage IV cancer. 336-342-7796 1-800-772-1213 ? [place] Aging, Disability and Transit Services: Assists with nutrition, care and transit needs. 336-349-2343         

## 2017-12-06 ENCOUNTER — Encounter (HOSPITAL_COMMUNITY): Payer: Self-pay | Admitting: Hematology

## 2017-12-06 NOTE — Progress Notes (Signed)
[facility] CONSULT NOTE  Patient Care Team: Eber Hong, MD as PCP - General (Internal Medicine)  CHIEF COMPLAINTS/PURPOSE OF CONSULTATION:  Newly diagnosed Left breast cancer  HISTORY OF PRESENTING ILLNESS:  Brenda Richmond 67 y.o. 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  She had a biopsy done in January 2019 which showed high-grade triple negative breast cancer.  Staging scans were negative.  She was started on neoadjuvant chemotherapy with dose dense Adriamycin and cyclophosphamide.  She has tolerated chemotherapy very well so far.  Not experiencing nausea, vomiting, diarrhea or constipation.  No signs or symptoms of PND or orthopnea reported.  She has completed 3 cycles of dose dense AC. I reviewed her records extensively and collaborated the history with the patient.  SUMMARY OF ONCOLOGIC HISTORY:   Malignant neoplasm of upper-outer quadrant of left breast in female, estrogen receptor negative ([place])   09/26/2017 Initial Biopsy    Biopsy of the left breast and left axillary lymph node consistent with high-grade TNBC, Ki-67 of 80%  Clinical stage T1cN2 M0      09/26/2017 Family History    Sister had breast cancer at age 28, HER-2 positive, multiple paternal uncles had prostate cancer, father had prostate cancer, 2 paternal cousins had breast cancer       10/11/2017 Echocardiogram    Echocardiogram on 10/11/2017 showed ejection fraction of 65-70%      10/18/2017 Imaging    CT scan of the chest, abdomen and pelvis shows tiny circumscribed noncalcified nodule in the posterior segment of the left upper lobe of the lung with no other evidence of metastatic disease  MRI of the abdomen with and without contrast ordered for elevated total bilirubin on 10/29/2017 shows no evidence of liver metastasis      10/22/2017 Genetic Testing    [facility] myRisk test for BRCA 1 and 2 and other mutations negative      10/24/2017 -   Neo-Adjuvant Chemotherapy    Dose dense AC for 4 cycles followed by weekly carboplatin and paclitaxel for 12 weeks       In terms of breast cancer risk profile:  She had 2 pregnancies and 2 births.  She is postmenopausal at age 47.  She used oral contraceptive pills for 15 years.  She has extensive family history of breast and prostate cancers as reported above.  MEDICAL HISTORY:  Past Medical History:  Diagnosis Date  . H/O seasonal allergies   . Hypertension   . Thrombosis    in right arm developed after 2nd round of chemotherapy    SURGICAL HISTORY: Past Surgical History:  Procedure Laterality Date  . BUNIONECTOMY Bilateral   . CYSTECTOMY     removed from left breast  . MELANOMA EXCISION Left    posterior forearm  . PORTA CATH INSERTION    . TUBAL LIGATION      SOCIAL HISTORY: Social History   Socioeconomic History  . Marital status: Divorced    Spouse name: Not on file  . Number of children: Not on file  . Years of education: Not on file  . Highest education level: Not on file  Occupational History  . Not on file  Social Needs  . Financial resource strain: Not on file  . Food insecurity:    Worry: Not on file    Inability: Not on file  . Transportation needs:    Medical: Not on file    Non-medical: Not on file  Tobacco Use  . Smoking status: Never Smoker  . Smokeless tobacco: Never Used  Substance and Sexual Activity  . Alcohol use: Never    Frequency: Never  . Drug use: Never  . Sexual activity: Not Currently  Lifestyle  . Physical activity:    Days per week: Not on file    Minutes per session: Not on file  . Stress: Not on file  Relationships  . Social connections:    Talks on phone: Not on file    Gets together: Not on file    Attends religious service: Not on file    Active member of club or organization: Not on file    Attends meetings of clubs or organizations: Not on file    Relationship status: Not on file  . Intimate partner violence:     Fear of current or ex partner: Not on file    Emotionally abused: Not on file    Physically abused: Not on file    Forced sexual activity: Not on file  Other Topics Concern  . Not on file  Social History Narrative  . Not on file    FAMILY HISTORY: History reviewed. No pertinent family history.  ALLERGIES:  is allergic to penicillin g.  MEDICATIONS:  Current Outpatient Medications  Medication Sig Dispense Refill  . amLODipine (NORVASC) 5 MG tablet Take 1 tablet by mouth daily.    Marland Kitchen atorvastatin (LIPITOR) 10 MG tablet Take 1 tablet by mouth daily.    Marland Kitchen CALCIUM PO Take 2 tablets by mouth daily.    Marland Kitchen ELIQUIS 5 MG TABS tablet Take 5 mg by mouth 2 (two) times daily.  3  . fexofenadine (ALLEGRA) 60 MG tablet Take 1 tablet by mouth daily as needed.    . hydrochlorothiazide (HYDRODIURIL) 25 MG tablet Take 1 tablet by mouth daily.    Marland Kitchen ibuprofen (ADVIL,MOTRIN) 200 MG tablet Take 2 tablets by mouth 3 (three) times daily as needed.    Marland Kitchen KLOR-CON M20 20 MEQ tablet Take 20 mEq by mouth daily.  4  . ondansetron (ZOFRAN) 4 MG tablet Take 4 mg by mouth 3 (three) times daily as needed.  3  . oxyCODONE-acetaminophen (PERCOCET/ROXICET) 5-325 MG tablet Take 1-2 tablets by mouth every 4 (four) hours as needed. for pain  0  . prochlorperazine (COMPAZINE) 10 MG tablet Take 10 mg by mouth every 6 (six) hours as needed.  3  . ramipril (ALTACE) 5 MG capsule Take 1 capsule by mouth daily.    . Vitamin D, Ergocalciferol, (DRISDOL) 50000 units CAPS capsule Take 1 capsule by mouth once a week. On Mondays     No current facility-administered medications for this visit.     REVIEW OF SYSTEMS:   Constitutional: Denies fevers, chills or abnormal night sweats Eyes: Denies blurriness of vision, double vision or watery eyes Ears, nose, mouth, throat, and face: Denies mucositis or sore throat Respiratory: Denies cough, dyspnea or wheezes Cardiovascular: Denies palpitation, chest discomfort or lower extremity  swelling Gastrointestinal:  Denies nausea, heartburn or change in bowel habits Skin: Denies abnormal skin rashes Lymphatics: Denies new lymphadenopathy or easy bruising Neurological:Denies numbness, tingling or new weaknesses Behavioral/Psych: Mood is stable, no new changes  Breast: Denies any palpable lumps or discharge All other systems were reviewed with the patient and are negative.  PHYSICAL EXAMINATION: ECOG PERFORMANCE STATUS: 0 - Asymptomatic   GENERAL:alert, no distress and comfortable SKIN: skin color, texture, turgor are normal, no rashes or significant lesions EYES: normal, conjunctiva are  pink and non-injected, sclera clear OROPHARYNX:no exudate, no erythema and lips, buccal mucosa, and tongue normal  NECK: supple, thyroid normal size, non-tender, without nodularity LYMPH:  no palpable lymphadenopathy in the cervical, axillary or inguinal LUNGS: clear to auscultation and percussion with normal breathing effort HEART: regular rate & rhythm and no murmurs and no lower extremity edema ABDOMEN:abdomen soft, non-tender and normal bowel sounds Musculoskeletal:no cyanosis of digits and no clubbing  PSYCH: alert & oriented x 3 with fluent speech NEURO: no focal motor/sensory deficits   LABORATORY DATA:  I have reviewed the data as listed Lab Results  Component Value Date   WBC 7.5 12/05/2017   HGB 12.2 (L) 12/05/2017   HCT 38.1 (L) 12/05/2017   MCV 84.7 12/05/2017   PLT 235 12/05/2017   Lab Results  Component Value Date   NA 138 12/05/2017   K 3.3 (L) 12/05/2017   CL 101 12/05/2017   CO2 24 12/05/2017    RADIOGRAPHIC STUDIES: I have personally reviewed the radiological reports and agreed with the findings in the report.  ASSESSMENT AND PLAN:  Malignant neoplasm of upper-outer quadrant of left breast in female, estrogen receptor negative ([place]) 1.  Clinical stage IIIc left breast TNBC: She is tolerating dose dense Adriamycin and cyclophosphamide amazingly well.   She may proceed with cycle 4 of dose dense regimen today without any dose modifications.  Her blood counts are adequate and reviewed by me.  She will come back in 2 weeks to initiate weekly paclitaxel.  I have discussed the results from 3 large randomized clinical trials including CALGB 61164, [place] GeparSixto trial, and [facility]igh[place] trial which have demonstrated improvement in pathological complete response (pCR) rates by adding carboplatin to the paclitaxel regimen.  Carboplatin can be added either at once every 3 weeks (AUC 6) or weekly along with Taxol (AUC 2).  It is associated with increased hematological toxicity.  By adding carboplatin, pCR rates as high as 55-60% were reported.  This is directly correlated with survival.  She is agreeable with this plan.  We plan to start it in 2 weeks.  I have called her insurance company and got authorization for it.  I would prefer to use it in a weekly regimen as it is more tolerable.  Benefit was seen in both BRCA positive and negative patients.  2.  Genetic testing: Because of her extensive family history, we have done genetic testing which was negative for commonly inherited mutations.   All questions were answered. The patient knows to call the clinic with any problems, questions or concerns.    Derek Jack, MD 12/06/17

## 2017-12-06 NOTE — Assessment & Plan Note (Signed)
1.  Clinical stage IIIc left breast TNBC: She is tolerating dose dense Adriamycin and cyclophosphamide amazingly well.  She may proceed with cycle 4 of dose dense regimen today without any dose modifications.  Her blood counts are adequate and reviewed by me.  She will come back in 2 weeks to initiate weekly paclitaxel.  I have discussed the results from 3 large randomized clinical trials including CALGB 71580, [place] GeparSixto trial, and [facility]igh[place] trial which have demonstrated improvement in pathological complete response (pCR) rates by adding carboplatin to the paclitaxel regimen.  Carboplatin can be added either at once every 3 weeks (AUC 6) or weekly along with Taxol (AUC 2).  It is associated with increased hematological toxicity.  By adding carboplatin, pCR rates as high as 55-60% were reported.  This is directly correlated with survival.  She is agreeable with this plan.  We plan to start it in 2 weeks.  I have called her insurance company and got authorization for it.  I would prefer to use it in a weekly regimen as it is more tolerable.  Benefit was seen in both BRCA positive and negative patients.  2.  Genetic testing: Because of her extensive family history, we have done genetic testing which was negative for commonly inherited mutations.

## 2017-12-10 ENCOUNTER — Telehealth (HOSPITAL_COMMUNITY): Payer: Self-pay | Admitting: *Deleted

## 2017-12-10 NOTE — Telephone Encounter (Signed)
[facility] called to inquire about patients ability to be seen in their office, with her currently receiving chemotherapy.  I spoke with Dr. Delton Coombes and he states that it is okay for them to see the patient.  She is receiving Neulasta and should be fine.  If she needs to have any extensive dental work done (crown, root canal, extractions) then she will need blood work done prior to procedure so that we can evaluate her ability to proceed.  For routine dental work such as cleanings and fillings there should be nothing else for our standpoint the patient needs.    I called back and talked with dentist office and advised them of the above.  They stated that they will contact patient and schedule her appointments. If they should need anything further from [place], they were instructed to give [place] a call.

## 2017-12-12 ENCOUNTER — Ambulatory Visit (HOSPITAL_COMMUNITY): Payer: Managed Care, Other (non HMO)

## 2017-12-20 ENCOUNTER — Other Ambulatory Visit: Payer: Self-pay

## 2017-12-20 ENCOUNTER — Encounter (HOSPITAL_COMMUNITY): Payer: Self-pay | Admitting: Hematology

## 2017-12-20 ENCOUNTER — Inpatient Hospital Stay (HOSPITAL_BASED_OUTPATIENT_CLINIC_OR_DEPARTMENT_OTHER): Payer: Managed Care, Other (non HMO) | Admitting: Hematology

## 2017-12-20 ENCOUNTER — Encounter (HOSPITAL_COMMUNITY): Payer: Self-pay

## 2017-12-20 ENCOUNTER — Inpatient Hospital Stay (HOSPITAL_COMMUNITY): Payer: Managed Care, Other (non HMO) | Attending: Hematology

## 2017-12-20 VITALS — BP 116/69 | HR 75 | Temp 97.9°F | Resp 18 | Wt 184.6 lb

## 2017-12-20 DIAGNOSIS — Z85820 Personal history of malignant melanoma of skin: Secondary | ICD-10-CM

## 2017-12-20 DIAGNOSIS — K5900 Constipation, unspecified: Secondary | ICD-10-CM | POA: Insufficient documentation

## 2017-12-20 DIAGNOSIS — Z8042 Family history of malignant neoplasm of prostate: Secondary | ICD-10-CM | POA: Diagnosis not present

## 2017-12-20 DIAGNOSIS — R110 Nausea: Secondary | ICD-10-CM

## 2017-12-20 DIAGNOSIS — Z7901 Long term (current) use of anticoagulants: Secondary | ICD-10-CM | POA: Insufficient documentation

## 2017-12-20 DIAGNOSIS — C50412 Malignant neoplasm of upper-outer quadrant of left female breast: Secondary | ICD-10-CM

## 2017-12-20 DIAGNOSIS — Z171 Estrogen receptor negative status [ER-]: Secondary | ICD-10-CM | POA: Diagnosis not present

## 2017-12-20 DIAGNOSIS — C50922 Malignant neoplasm of unspecified site of left male breast: Secondary | ICD-10-CM | POA: Insufficient documentation

## 2017-12-20 DIAGNOSIS — Z803 Family history of malignant neoplasm of breast: Secondary | ICD-10-CM | POA: Insufficient documentation

## 2017-12-20 DIAGNOSIS — R42 Dizziness and giddiness: Secondary | ICD-10-CM | POA: Diagnosis not present

## 2017-12-20 DIAGNOSIS — R5383 Other fatigue: Secondary | ICD-10-CM | POA: Insufficient documentation

## 2017-12-20 DIAGNOSIS — Z79899 Other long term (current) drug therapy: Secondary | ICD-10-CM | POA: Diagnosis not present

## 2017-12-20 DIAGNOSIS — Z5111 Encounter for antineoplastic chemotherapy: Secondary | ICD-10-CM | POA: Insufficient documentation

## 2017-12-20 LAB — COMPREHENSIVE METABOLIC PANEL
ALK PHOS: 99 U/L (ref 38–126)
ALT: 23 U/L (ref 17–63)
ANION GAP: 11 (ref 5–15)
AST: 18 U/L (ref 15–41)
Albumin: 4 g/dL (ref 3.5–5.0)
BILIRUBIN TOTAL: 0.4 mg/dL (ref 0.3–1.2)
BUN: 10 mg/dL (ref 6–20)
CALCIUM: 10 mg/dL (ref 8.9–10.3)
CO2: 25 mmol/L (ref 22–32)
CREATININE: 0.64 mg/dL (ref 0.61–1.24)
Chloride: 104 mmol/L (ref 101–111)
GFR calc non Af Amer: >60 mL/min (ref >60)
GLUCOSE: 120 mg/dL — AB (ref 65–99)
Potassium: 3.8 mmol/L (ref 3.5–5.1)
Sodium: 140 mmol/L (ref 135–145)
TOTAL PROTEIN: 7.1 g/dL (ref 6.5–8.1)

## 2017-12-20 LAB — CBC WITH DIFFERENTIAL/PLATELET
BASOS PCT: 1 %
Basophils Absolute: 0.1 10*3/uL (ref 0.0–0.1)
EOS PCT: 1 %
Eosinophils Absolute: 0.1 10*3/uL (ref 0.0–0.7)
HCT: 35.9 % — ABNORMAL LOW (ref 39.0–52.0)
HEMOGLOBIN: 11.3 g/dL — AB (ref 13.0–17.0)
Lymphocytes Relative: 12 %
Lymphs Abs: 1 10*3/uL (ref 0.7–4.0)
MCH: 26.8 pg (ref 26.0–34.0)
MCHC: 31.5 g/dL (ref 30.0–36.0)
MCV: 85.1 fL (ref 78.0–100.0)
MONO ABS: 0.4 10*3/uL (ref 0.1–1.0)
Monocytes Relative: 5 %
NEUTROS ABS: 6.7 10*3/uL (ref 1.7–7.7)
NEUTROS PCT: 81 %
PLATELETS: 244 10*3/uL (ref 150–400)
RBC: 4.22 MIL/uL (ref 4.22–5.81)
RDW: 14.9 % (ref 11.5–15.5)
WBC: 8.3 10*3/uL (ref 4.0–10.5)

## 2017-12-20 MED ORDER — DIPHENHYDRAMINE HCL 50 MG/ML IJ SOLN
INTRAMUSCULAR | Status: AC
  Filled 2017-12-20: qty 1

## 2017-12-20 MED ORDER — PALONOSETRON HCL INJECTION 0.25 MG/5ML
0.2500 mg | Freq: Once | INTRAVENOUS | Status: AC
  Administered 2017-12-20: 0.25 mg via INTRAVENOUS

## 2017-12-20 MED ORDER — DIPHENHYDRAMINE HCL 50 MG/ML IJ SOLN
50.0000 mg | Freq: Once | INTRAMUSCULAR | Status: AC
  Administered 2017-12-20: 50 mg via INTRAVENOUS

## 2017-12-20 MED ORDER — FAMOTIDINE IN NACL 20-0.9 MG/50ML-% IV SOLN
20.0000 mg | Freq: Once | INTRAVENOUS | Status: AC
  Administered 2017-12-20: 20 mg via INTRAVENOUS
  Filled 2017-12-20: qty 50

## 2017-12-20 MED ORDER — HEPARIN SOD (PORK) LOCK FLUSH 100 UNIT/ML IV SOLN
500.0000 [IU] | Freq: Once | INTRAVENOUS | Status: AC | PRN
  Administered 2017-12-20: 500 [IU]
  Filled 2017-12-20: qty 5

## 2017-12-20 MED ORDER — SODIUM CHLORIDE 0.9 % IV SOLN
Freq: Once | INTRAVENOUS | Status: AC
  Administered 2017-12-20: 11:00:00 via INTRAVENOUS
  Filled 2017-12-20: qty 5

## 2017-12-20 MED ORDER — PACLITAXEL CHEMO INJECTION 300 MG/50ML
80.0000 mg/m2 | Freq: Once | INTRAVENOUS | Status: AC
  Administered 2017-12-20: 150 mg via INTRAVENOUS
  Filled 2017-12-20: qty 25

## 2017-12-20 MED ORDER — PALONOSETRON HCL INJECTION 0.25 MG/5ML
INTRAVENOUS | Status: AC
  Filled 2017-12-20: qty 5

## 2017-12-20 MED ORDER — SODIUM CHLORIDE 0.9 % IV SOLN
Freq: Once | INTRAVENOUS | Status: AC
  Administered 2017-12-20: 10:00:00 via INTRAVENOUS

## 2017-12-20 MED ORDER — SODIUM CHLORIDE 0.9 % IV SOLN
230.8000 mg | Freq: Once | INTRAVENOUS | Status: AC
  Administered 2017-12-20: 230 mg via INTRAVENOUS
  Filled 2017-12-20: qty 23

## 2017-12-20 NOTE — Assessment & Plan Note (Signed)
1.  Clinical stage IIIc left breast TNBC: She has tolerated 4 cycles of dose dense AC very well.  She only had minor nausea but denied any vomiting.  She had some constipation but denied any diarrhea.  No signs or symptoms of PND or orthopnea.  Today she will proceed with weekly paclitaxel.  I have discussed the results from 3 large randomized clinical trials including CALGB 73192, [place] GeparSixto trial, and [facility]igh[place]ess trial which have demonstrated improvement in pathological complete response (pCR) rates by adding carboplatin to the paclitaxel regimen.  Carboplatin can be added either at once every 3 weeks (AUC 6) or weekly along with Taxol (AUC 2).  It is associated with increased hematological toxicity.  By adding carboplatin, pCR rates as high as 55-60% were reported.  This is directly correlated with survival.  She is agreeable with this plan. Benefit was seen in both BRCA positive and negative patients.  Because of better tolerability, I will add carboplatin AUC 2 weekly with paclitaxel.  She will come back weekly for treatment.  I will see her once every other week.  2.  Genetic testing: Because of her extensive family history, we have done genetic testing which was negative for commonly inherited mutations.

## 2017-12-20 NOTE — Progress Notes (Signed)
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  CURRENT THERAPY: Weekly paclitaxel and carboplatin.  BRIEF ONCOLOGIC HISTORY:    Malignant neoplasm of upper-outer quadrant of left breast in female, estrogen receptor negative ([place])   09/26/2017 Initial Biopsy    Biopsy of the left breast and left axillary lymph node consistent with high-grade TNBC, Ki-67 of 80%  Clinical stage T1cN2 M0      09/26/2017 Family History    Sister had breast cancer at age 33, HER-2 positive, multiple paternal uncles had prostate cancer, father had prostate cancer, 2 paternal cousins had breast cancer       10/11/2017 Echocardiogram    Echocardiogram on 10/11/2017 showed ejection fraction of 65-70%      10/18/2017 Imaging    CT scan of the chest, abdomen and pelvis shows tiny circumscribed noncalcified nodule in the posterior segment of the left upper lobe of the lung with no other evidence of metastatic disease  MRI of the abdomen with and without contrast ordered for elevated total bilirubin on 10/29/2017 shows no evidence of liver metastasis      10/22/2017 Genetic Testing    [facility] myRisk test for BRCA 1 and 2 and other mutations negative      10/24/2017 -  Neo-Adjuvant Chemotherapy    Dose dense AC for 4 cycles followed by weekly carboplatin and paclitaxel for 12 weeks        CANCER STAGING: Cancer Staging Malignant neoplasm of upper-outer quadrant of left breast in female, estrogen receptor negative ([place]) Staging form: Breast, AJCC 8th Edition - Clinical: Stage IIIC (cT1, cN2, cM0, G3, ER: Negative, PR: Negative, HER2: Negative) - Signed by Holley Bouche, NP on 11/28/2017    INTERVAL HISTORY:  Mr. Branagan 67 y.o. female returns for routine follow-up and consideration for next cycle  of chemotherapy.  She is due to start her weekly Taxol and carboplatin.  After her last cycle, she felt slightly tired.  She had mild nausea.  She denied any vomiting.  No diarrhea was reported.  She did have some constipation which was manageable.  She had minor dental work done after last cycle.  She does not report any pain in her tooth at this time.  She does not have any history of tingling or numbness in the extremities.   REVIEW OF SYSTEMS:  Review of Systems  Constitutional: Positive for fatigue.  HENT:  Negative.   Eyes: Negative.   Respiratory: Negative.   Cardiovascular: Negative.   Gastrointestinal: Positive for constipation and nausea.  Genitourinary: Negative.    Musculoskeletal: Negative.   Skin: Negative.   Neurological: Negative.   Hematological: Negative.   Psychiatric/Behavioral: Negative.      PAST MEDICAL/SURGICAL HISTORY:  Past Medical History:  Diagnosis Date  . H/O seasonal allergies   . Hypertension   . Thrombosis    in right arm developed after 2nd round of chemotherapy   Past Surgical History:  Procedure Laterality Date  . BUNIONECTOMY Bilateral   . CYSTECTOMY     removed from left breast  . MELANOMA EXCISION Left    posterior forearm  . PORTA CATH INSERTION    . TUBAL LIGATION       SOCIAL HISTORY:  Social History   Socioeconomic History  . Marital status: Divorced  Spouse name: Not on file  . Number of children: Not on file  . Years of education: Not on file  . Highest education level: Not on file  Occupational History  . Not on file  Social Needs  . Financial resource strain: Not on file  . Food insecurity:    Worry: Not on file    Inability: Not on file  . Transportation needs:    Medical: Not on file    Non-medical: Not on file  Tobacco Use  . Smoking status: Never Smoker  . Smokeless tobacco: Never Used  Substance and Sexual Activity  . Alcohol use: Never    Frequency: Never  . Drug use: Never  . Sexual activity: Not  Currently  Lifestyle  . Physical activity:    Days per week: Not on file    Minutes per session: Not on file  . Stress: Not on file  Relationships  . Social connections:    Talks on phone: Not on file    Gets together: Not on file    Attends religious service: Not on file    Active member of club or organization: Not on file    Attends meetings of clubs or organizations: Not on file    Relationship status: Not on file  . Intimate partner violence:    Fear of current or ex partner: Not on file    Emotionally abused: Not on file    Physically abused: Not on file    Forced sexual activity: Not on file  Other Topics Concern  . Not on file  Social History Narrative  . Not on file    FAMILY HISTORY:  No family history on file.  CURRENT MEDICATIONS:  Outpatient Encounter Medications as of 12/20/2017  Medication Sig  . amLODipine (NORVASC) 5 MG tablet Take 1 tablet by mouth daily.  Marland Kitchen atorvastatin (LIPITOR) 10 MG tablet Take 1 tablet by mouth daily.  Marland Kitchen CALCIUM PO Take 2 tablets by mouth daily.  Marland Kitchen ELIQUIS 5 MG TABS tablet Take 5 mg by mouth 2 (two) times daily.  . fexofenadine (ALLEGRA) 60 MG tablet Take 1 tablet by mouth daily as needed.  . hydrochlorothiazide (HYDRODIURIL) 25 MG tablet Take 1 tablet by mouth daily.  Marland Kitchen ibuprofen (ADVIL,MOTRIN) 200 MG tablet Take 2 tablets by mouth 3 (three) times daily as needed.  Marland Kitchen KLOR-CON M20 20 MEQ tablet Take 20 mEq by mouth daily.  . ondansetron (ZOFRAN) 4 MG tablet Take 4 mg by mouth 3 (three) times daily as needed.  Marland Kitchen oxyCODONE-acetaminophen (PERCOCET/ROXICET) 5-325 MG tablet Take 1-2 tablets by mouth every 4 (four) hours as needed. for pain  . prochlorperazine (COMPAZINE) 10 MG tablet Take 10 mg by mouth every 6 (six) hours as needed.  . ramipril (ALTACE) 5 MG capsule Take 1 capsule by mouth daily.  . Vitamin D, Ergocalciferol, (DRISDOL) 50000 units CAPS capsule Take 1 capsule by mouth once a week. On Mondays   No facility-administered  encounter medications on file as of 12/20/2017.     ALLERGIES:  Allergies  Allergen Reactions  . Penicillin G      PHYSICAL EXAM:  ECOG Performance status: 1  I have reviewed vital signs.   LABORATORY DATA:  I have reviewed the labs as listed.  CBC    Component Value Date/Time   WBC 8.3 12/20/2017 0919   RBC 4.22 12/20/2017 0919   HGB 11.3 (L) 12/20/2017 0919   HCT 35.9 (L) 12/20/2017 0919   PLT 244 12/20/2017 0919  MCV 85.1 12/20/2017 0919   MCH 26.8 12/20/2017 0919   MCHC 31.5 12/20/2017 0919   RDW 14.9 12/20/2017 0919   LYMPHSABS 1.0 12/20/2017 0919   MONOABS 0.4 12/20/2017 0919   EOSABS 0.1 12/20/2017 0919   BASOSABS 0.1 12/20/2017 0919   CMP Latest Ref Rng & Units 12/20/2017 12/05/2017  Glucose 65 - 99 mg/dL 120(H) 151(H)  BUN 6 - 20 mg/dL 10 14  Creatinine 0.61 - 1.24 mg/dL 0.64 0.63  Sodium 135 - 145 mmol/L 140 138  Potassium 3.5 - 5.1 mmol/L 3.8 3.3(L)  Chloride 101 - 111 mmol/L 104 101  CO2 22 - 32 mmol/L 25 24  Calcium 8.9 - 10.3 mg/dL 10.0 9.9  Total Protein 6.5 - 8.1 g/dL 7.1 7.4  Total Bilirubin 0.3 - 1.2 mg/dL 0.4 0.9  Alkaline Phos 38 - 126 U/L 99 117  AST 15 - 41 U/L 18 25  ALT 17 - 63 U/L 23 40       ASSESSMENT & PLAN:   Malignant neoplasm of upper-outer quadrant of left breast in female, estrogen receptor negative ([place]) 1.  Clinical stage IIIc left breast TNBC: She has tolerated 4 cycles of dose dense AC very well.  She only had minor nausea but denied any vomiting.  She had some constipation but denied any diarrhea.  No signs or symptoms of PND or orthopnea.  Today she will proceed with weekly paclitaxel.  I have discussed the results from 3 large randomized clinical trials including CALGB 14970, [place] GeparSixto trial, and [place]igh[place]ess trial which have demonstrated improvement in pathological complete response (pCR) rates by adding carboplatin to the paclitaxel regimen.  Carboplatin can be added either at once every 3 weeks (AUC 6) or weekly  along with Taxol (AUC 2).  It is associated with increased hematological toxicity.  By adding carboplatin, pCR rates as high as 55-60% were reported.  This is directly correlated with survival.  She is agreeable with this plan. Benefit was seen in both BRCA positive and negative patients.  Because of better tolerability, I will add carboplatin AUC 2 weekly with paclitaxel.  She will come back weekly for treatment.  I will see her once every other week.  2.  Genetic testing: Because of her extensive family history, we have done genetic testing which was negative for commonly inherited mutations.      Orders placed this encounter:  Orders Placed This Encounter  Procedures  . CBC with Differential  . Comprehensive metabolic panel  . Comprehensive metabolic panel  . CBC with Differential      Derek Jack, MD [place] [phone]

## 2017-12-20 NOTE — Progress Notes (Signed)
Tolerated infusions w/o adverse reaction.  Alert, in no distress.  VSS.  Discharged ambulatory in c/o family.  

## 2017-12-21 ENCOUNTER — Telehealth (HOSPITAL_COMMUNITY): Payer: Self-pay

## 2017-12-21 NOTE — Telephone Encounter (Signed)
24 hour follow up -left message for patient to call the direct line to the clinic if he has any concerns or questions.

## 2017-12-26 ENCOUNTER — Other Ambulatory Visit (HOSPITAL_COMMUNITY): Payer: Self-pay | Admitting: Pharmacist

## 2017-12-27 ENCOUNTER — Inpatient Hospital Stay (HOSPITAL_COMMUNITY): Payer: Managed Care, Other (non HMO)

## 2017-12-27 VITALS — BP 126/73 | HR 82 | Temp 97.7°F | Resp 18 | Wt 185.8 lb

## 2017-12-27 DIAGNOSIS — C50922 Malignant neoplasm of unspecified site of left male breast: Secondary | ICD-10-CM | POA: Diagnosis not present

## 2017-12-27 DIAGNOSIS — C50412 Malignant neoplasm of upper-outer quadrant of left female breast: Secondary | ICD-10-CM

## 2017-12-27 DIAGNOSIS — Z171 Estrogen receptor negative status [ER-]: Principal | ICD-10-CM

## 2017-12-27 MED ORDER — DIPHENHYDRAMINE HCL 50 MG/ML IJ SOLN
50.0000 mg | Freq: Once | INTRAMUSCULAR | Status: AC
  Administered 2017-12-27: 50 mg via INTRAVENOUS

## 2017-12-27 MED ORDER — PALONOSETRON HCL INJECTION 0.25 MG/5ML
INTRAVENOUS | Status: AC
  Filled 2017-12-27: qty 5

## 2017-12-27 MED ORDER — DIPHENHYDRAMINE HCL 50 MG/ML IJ SOLN
INTRAMUSCULAR | Status: AC
  Filled 2017-12-27: qty 1

## 2017-12-27 MED ORDER — HEPARIN SOD (PORK) LOCK FLUSH 100 UNIT/ML IV SOLN
INTRAVENOUS | Status: AC
  Filled 2017-12-27: qty 5

## 2017-12-27 MED ORDER — SODIUM CHLORIDE 0.9 % IV SOLN
20.0000 mg | Freq: Once | INTRAVENOUS | Status: AC
  Administered 2017-12-27: 20 mg via INTRAVENOUS
  Filled 2017-12-27: qty 2

## 2017-12-27 MED ORDER — FAMOTIDINE IN NACL 20-0.9 MG/50ML-% IV SOLN
INTRAVENOUS | Status: AC
  Filled 2017-12-27: qty 50

## 2017-12-27 MED ORDER — PACLITAXEL CHEMO INJECTION 300 MG/50ML
80.0000 mg/m2 | Freq: Once | INTRAVENOUS | Status: AC
  Administered 2017-12-27: 150 mg via INTRAVENOUS
  Filled 2017-12-27: qty 25

## 2017-12-27 MED ORDER — FAMOTIDINE IN NACL 20-0.9 MG/50ML-% IV SOLN
20.0000 mg | Freq: Once | INTRAVENOUS | Status: AC
  Administered 2017-12-27: 20 mg via INTRAVENOUS

## 2017-12-27 MED ORDER — SODIUM CHLORIDE 0.9 % IV SOLN
184.6400 mg | Freq: Once | INTRAVENOUS | Status: AC
  Administered 2017-12-27: 180 mg via INTRAVENOUS
  Filled 2017-12-27: qty 18

## 2017-12-27 MED ORDER — PALONOSETRON HCL INJECTION 0.25 MG/5ML
0.2500 mg | Freq: Once | INTRAVENOUS | Status: AC
  Administered 2017-12-27: 0.25 mg via INTRAVENOUS

## 2017-12-27 MED ORDER — HEPARIN SOD (PORK) LOCK FLUSH 100 UNIT/ML IV SOLN
500.0000 [IU] | Freq: Once | INTRAVENOUS | Status: AC | PRN
  Administered 2017-12-27: 500 [IU]

## 2017-12-27 MED ORDER — SODIUM CHLORIDE 0.9 % IV SOLN
Freq: Once | INTRAVENOUS | Status: AC
  Administered 2017-12-27: 13:00:00 via INTRAVENOUS

## 2017-12-27 NOTE — Progress Notes (Signed)
Tolerated infusions w/o adverse reaction.  Alert, in no distress.  VSS.  Discharged ambulatory in c/o family for transport home.

## 2018-01-03 ENCOUNTER — Inpatient Hospital Stay (HOSPITAL_BASED_OUTPATIENT_CLINIC_OR_DEPARTMENT_OTHER): Payer: Managed Care, Other (non HMO) | Admitting: Hematology

## 2018-01-03 ENCOUNTER — Other Ambulatory Visit: Payer: Self-pay

## 2018-01-03 ENCOUNTER — Encounter (HOSPITAL_COMMUNITY): Payer: Self-pay

## 2018-01-03 ENCOUNTER — Inpatient Hospital Stay (HOSPITAL_COMMUNITY): Payer: Managed Care, Other (non HMO)

## 2018-01-03 ENCOUNTER — Encounter (HOSPITAL_COMMUNITY): Payer: Self-pay | Admitting: Hematology

## 2018-01-03 VITALS — BP 126/74 | HR 76 | Temp 97.8°F | Resp 16 | Wt 185.8 lb

## 2018-01-03 DIAGNOSIS — C50412 Malignant neoplasm of upper-outer quadrant of left female breast: Secondary | ICD-10-CM

## 2018-01-03 DIAGNOSIS — R42 Dizziness and giddiness: Secondary | ICD-10-CM

## 2018-01-03 DIAGNOSIS — Z171 Estrogen receptor negative status [ER-]: Principal | ICD-10-CM

## 2018-01-03 DIAGNOSIS — R110 Nausea: Secondary | ICD-10-CM

## 2018-01-03 DIAGNOSIS — Z803 Family history of malignant neoplasm of breast: Secondary | ICD-10-CM | POA: Diagnosis not present

## 2018-01-03 DIAGNOSIS — Z85820 Personal history of malignant melanoma of skin: Secondary | ICD-10-CM | POA: Diagnosis not present

## 2018-01-03 DIAGNOSIS — R5383 Other fatigue: Secondary | ICD-10-CM

## 2018-01-03 DIAGNOSIS — K5900 Constipation, unspecified: Secondary | ICD-10-CM

## 2018-01-03 DIAGNOSIS — Z79899 Other long term (current) drug therapy: Secondary | ICD-10-CM

## 2018-01-03 DIAGNOSIS — C50922 Malignant neoplasm of unspecified site of left male breast: Secondary | ICD-10-CM | POA: Diagnosis not present

## 2018-01-03 DIAGNOSIS — Z8042 Family history of malignant neoplasm of prostate: Secondary | ICD-10-CM | POA: Diagnosis not present

## 2018-01-03 DIAGNOSIS — Z7901 Long term (current) use of anticoagulants: Secondary | ICD-10-CM

## 2018-01-03 MED ORDER — SODIUM CHLORIDE 0.9% FLUSH
10.0000 mL | Freq: Once | INTRAVENOUS | Status: AC
  Administered 2018-01-03: 10 mL via INTRAVENOUS

## 2018-01-03 MED ORDER — HEPARIN SOD (PORK) LOCK FLUSH 100 UNIT/ML IV SOLN
500.0000 [IU] | Freq: Once | INTRAVENOUS | Status: AC
  Administered 2018-01-03: 500 [IU] via INTRAVENOUS

## 2018-01-03 NOTE — Assessment & Plan Note (Signed)
1.  Clinical stage IIIc left breast TNBC: She has tolerated 4 cycles of dose dense AC very well.  She only had minor nausea but denied any vomiting.  She had some constipation but denied any diarrhea.  No signs or symptoms of PND or orthopnea.  Today she will proceed with weekly paclitaxel.  I have discussed the results from 3 large randomized clinical trials including CALGB 46190, [place] GeparSixto trial, and Brigh[place]ess trial which have demonstrated improvement in pathological complete response (pCR) rates by adding carboplatin to the paclitaxel regimen.  Carboplatin can be added either at once every 3 weeks (AUC 6) or weekly along with Taxol (AUC 2).  It is associated with increased hematological toxicity.  By adding carboplatin, pCR rates as high as 55-60% were reported.  This is directly correlated with survival.  Benefit was seen in both BRCA positive and negative patients.  Hence we started her on weekly carboplatin and paclitaxel.  She tolerated 2 cycles very well.  She denied any nausea, vomiting, diarrhea or peripheral neuropathy.  Her appetite has been great.  Energy levels are better.  However today had a white count is 1.8.  ANC is 800.  We will delay her chemotherapy by 1 week.  During week 2 I have reduced dose of carboplatin secondary to mildly decreased white count.  She might need intermittent Neupogen injections to keep her on schedule.  2.  Genetic testing: She had extensive family history of malignancies.  We have done genetic testing for commonly inheritable genetic mutations, it was reported negative.

## 2018-01-03 NOTE — Progress Notes (Signed)
Patient to room for oncology follow up visit and treatment.  Stated prn constipation and denied neuropathy.  Denied fevers and chills.   Patients treatment held due to [facility].  Reviewed neutropenia precautions with patient and family with understanding verbalized.  Port flushed per protocol.  No bruising or swelling noted at site.  No complaints of pain with flush.  Band aid applied. VSS with discharge and left ambulatory with no s/s of distress noted.

## 2018-01-03 NOTE — Patient Instructions (Signed)
Neutropenia Neutropenia is a condition that occurs when you have a lower-than-normal level of a type of white blood cell (neutrophil) in your body. Neutrophils are made in the spongy center of large bones (bone marrow) and they fight infections. Neutrophils are your body's main defense against bacterial and fungal infections. The fewer neutrophils you have and the longer your body remains without them, the greater your risk of getting a severe infection. What are the causes? This condition can occur if your body uses up or destroys neutrophils faster than your bone marrow can make them. This problem may happen because of:  Bacterial or fungal infection.  Allergic disorders.  Reactions to some medicines.  Autoimmune disease.  An enlarged spleen.  This condition can also occur if your bone marrow does not produce enough neutrophils. This problem may be caused by:  Cancer.  Cancer treatments, such as radiation or chemotherapy.  Viral infections.  Medicines, such as phenytoin.  Vitamin B12 deficiency.  Diseases of the bone marrow.  Environmental toxins, such as insecticides.  What are the signs or symptoms? This condition does not usually cause symptoms. If symptoms are present, they are usually caused by an underlying infection. Symptoms of an infection may include:  Fever.  Chills.  Swollen glands.  Oral or anal ulcers.  Cough and shortness of breath.  Rash.  Skin infection.  Fatigue.  How is this diagnosed? Your health care provider may suspect neutropenia if you have:  A condition that may cause neutropenia.  Symptoms of infection, especially fever.  Frequent and unusual infections.  You will have a medical history and physical exam. Tests will also be done, such as:  A complete blood count (CBC).  A procedure to collect a sample of bone marrow for examination (bone marrow biopsy).  A chest X-ray.  A urine culture.  A blood culture.  How is this  treated? Treatment depends on the underlying cause and severity of your condition. Mild neutropenia may not require treatment. Treatment may include medicines, such as:  Antibiotic medicine given through an IV tube.  Antiviral medicines.  Antifungal medicines.  A medicine to increase neutrophil production (colony-stimulating factor). You may get this drug through an IV tube or by injection.  Steroids given through an IV tube.  If an underlying condition is causing neutropenia, you may need treatment for that condition. If medicines you are taking are causing neutropenia, your health care provider may have you stop taking those medicines. Follow these instructions at home: Medicines  Take over-the-counter and prescription medicines only as told by your health care provider.  Get a seasonal flu shot (influenza vaccine). Lifestyle  Do not eat unpasteurized foods.Do not eat unwashed raw fruits or vegetables.  Avoid exposure to groups of people or children.  Avoid being around people who are sick.  Avoid being around dirt or dust, such as in construction areas or gardens.  Do not provide direct care for pets. Avoid animal droppings. Do not clean litter boxes and bird cages. Hygiene   Bathe daily.  Clean the area between the genitals and the anus (perineal area) after you urinate or have a bowel movement. If you are female, wipe from front to back.  Brush your teeth with a soft toothbrush before and after meals.  Do not use a razor that has a blade. Use an electric razor to remove hair.  Wash your hands often. Make sure others who come in contact with you also wash their hands. If soap and water  are not available, use hand sanitizer. General instructions  Do not have sex unless your health care provider has approved.  Take actions to avoid cuts and burns. For example: ? Be cautious when you use knives. Always cut away from yourself. ? Keep knives in protective sheaths or  guards when not in use. ? Use oven mitts when you cook with a hot stove, oven, or grill. ? Stand a safe distance away from open fires.  Avoid people who received a vaccine in the past 30 days if that vaccine contained a live version of the germ (live vaccine). You should not get a live vaccine. Common live vaccines are varicella, measles, mumps, and rubella.  Do not share food utensils.  Do not use tampons, enemas, or rectal suppositories unless your health care provider has approved.  Keep all appointments as told by your health care provider. This is important. Contact a health care provider if:  You have a fever.  You have chills or you start to shake.  You have: ? A sore throat. ? A warm, red, or tender area on your skin. ? A cough. ? Frequent or painful urination. ? Vaginal discharge or itching.  You develop: ? Sores in your mouth or anus. ? Swollen lymph nodes. ? Red streaks on the skin. ? A rash.  You feel: ? Nauseous or you vomit. ? Very fatigued. ? Short of breath. This information is not intended to replace advice given to you by your health care provider. Make sure you discuss any questions you have with your health care provider. Document Released: 02/17/2002 Document Revised: 02/03/2016 Document Reviewed: 03/10/2015 Elsevier Interactive Patient Education  2018 Elsevier Inc.  

## 2018-01-03 NOTE — Patient Instructions (Signed)
[facility] Discharge Instructions  Today you saw Dr. K.   Thank you for choosing [facility] to provide your oncology and hematology care.  To afford each patient quality time with our provider, please arrive at least 15 minutes before your scheduled appointment time.   If you have a lab appointment with the Cancer Center please come in thru the  Main Entrance and check in at the main information desk  You need to re-schedule your appointment should you arrive 10 or more minutes late.  We strive to give you quality time with our providers, and arriving late affects you and other patients whose appointments are after yours.  Also, if you no show three or more times for appointments you may be dismissed from the clinic at the providers discretion.     Again, thank you for choosing [facility].  Our hope is that these requests will decrease the amount of time that you wait before being seen by our physicians.       _____________________________________________________________  Should you have questions after your visit to [facility], please contact our office at [phone] between the hours of 8:30 a.m. and 4:30 p.m.  Voicemails left after 4:30 p.m. will not be returned until the following business day.  For prescription refill requests, have your pharmacy contact our office.       Resources For Cancer Patients and their Caregivers ? American Cancer Society: Can assist with transportation, wigs, general needs, runs Look Good Feel Better.        1-888-227-6333 ? Cancer Care: Provides financial assistance, online support groups, medication/co-pay assistance.  1-800-813-HOPE (4673) ? [facility] Assists [place] cancer patients and their families through emotional , educational and financial support.  336-427-4357 ? [place] DSS Where to apply for food  stamps, Medicaid and utility assistance. 336-342-1394 ? [facility]: Transportation to medical appointments. 336-347-2287 ? Social Security Administration: May apply for disability if have a Stage IV cancer. 336-342-7796 1-800-772-1213 ? [place] Aging, Disability and Transit Services: Assists with nutrition, care and transit needs. 336-349-2343  Cancer Center Support Programs:   > Cancer Support Group  2nd Tuesday of the month 1pm-2pm, Journey Room   > Creative Journey  3rd Tuesday of the month 1130am-1pm, Journey Room    

## 2018-01-03 NOTE — Progress Notes (Signed)
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  CURRENT THERAPY: Weekly paclitaxel and carboplatin.  BRIEF ONCOLOGIC HISTORY:    Malignant neoplasm of upper-outer quadrant of left breast in female, estrogen receptor negative ([place])   09/26/2017 Initial Biopsy    Biopsy of the left breast and left axillary lymph node consistent with high-grade TNBC, Ki-67 of 80%  Clinical stage T1cN2 M0      09/26/2017 Family History    Sister had breast cancer at age 2, HER-2 positive, multiple paternal uncles had prostate cancer, father had prostate cancer, 2 paternal cousins had breast cancer       10/11/2017 Echocardiogram    Echocardiogram on 10/11/2017 showed ejection fraction of 65-70%      10/18/2017 Imaging    CT scan of the chest, abdomen and pelvis shows tiny circumscribed noncalcified nodule in the posterior segment of the left upper lobe of the lung with no other evidence of metastatic disease  MRI of the abdomen with and without contrast ordered for elevated total bilirubin on 10/29/2017 shows no evidence of liver metastasis      10/22/2017 Genetic Testing    [facility] myRisk test for BRCA 1 and 2 and other mutations negative      10/23/2017 -  Chemotherapy    The patient had DOXOrubicin (ADRIAMYCIN) chemo injection 114 mg, 60 mg/m2, Intravenous,  Once, 4 of 4 cycles Administration: 114 mg (12/05/2017) palonosetron (ALOXI) injection 0.25 mg, 0.25 mg, Intravenous,  Once, 5 of 8 cycles Administration: 0.25 mg (12/20/2017), 0.25 mg (12/05/2017), 0.25 mg (12/27/2017) pegfilgrastim (NEULASTA ONPRO KIT) injection 6 mg, 6 mg, Subcutaneous, Once, 1 of 1 cycle Administration: 6 mg (12/05/2017) filgrastim (NEUPOGEN) injection 480 mcg, 480 mcg, Subcutaneous, Once PRN, 0 of 3  cycles pegfilgrastim-cbqv (UDENYCA) injection 6 mg, 6 mg, Subcutaneous, Once, 3 of 3 cycles CARBOplatin (PARAPLATIN) 230 mg in sodium chloride 0.9 % 250 mL chemo infusion, 230 mg (100 % of original dose 230.8 mg), Intravenous,  Once, 1 of 4 cycles Dose modification:   (original dose 230.8 mg, Cycle 5),   (original dose 230.8 mg, Cycle 5), 184.64 mg (80 % of original dose 230.8 mg, Cycle 5, Reason: Provider Judgment) Administration: 230 mg (12/20/2017), 180 mg (12/27/2017) cyclophosphamide (CYTOXAN) 1,140 mg in sodium chloride 0.9 % 250 mL chemo infusion, 600 mg/m2, Intravenous,  Once, 4 of 4 cycles Administration: 1,140 mg (12/05/2017) PACLitaxel (TAXOL) 150 mg in dextrose 5 % 250 mL chemo infusion (</= 84m/m2), 80 mg/m2 = 150 mg, Intravenous,  Once, 1 of 4 cycles Administration: 150 mg (12/20/2017), 150 mg (12/27/2017) fosaprepitant (EMEND) 150 mg, dexamethasone (DECADRON) 12 mg in sodium chloride 0.9 % 145 mL IVPB, , Intravenous,  Once, 5 of 5 cycles Administration:  (12/20/2017),  (12/05/2017)  for chemotherapy treatment.       10/24/2017 -  Neo-Adjuvant Chemotherapy    Dose dense AC for 4 cycles followed by weekly carboplatin and paclitaxel for 12 weeks        CANCER STAGING: Cancer Staging Malignant neoplasm of upper-outer quadrant of left breast in female, estrogen receptor negative ([facility] Staging form: Breast, AJCC 8th Edition - Clinical: Stage IIIC (cT1, cN2, cM0, G3, ER: Negative, PR: Negative, HER2: Negative) - Signed by DHolley Bouche NP on 11/28/2017     INTERVAL HISTORY:  Mr. MLabarre  67 y.o. female returns for routine follow-up and consideration for next cycle of chemotherapy.   Here today with family.   Carboplatin dose-reduced at last cycle.    Overall, she feels well.  She feels a little tired, but "that is better with this treatment than the first one."  Denies any peripheral neuropathy. Endorses some dizziness, particularly with position changes "or when I bend over and  stand up."  Dizziness is worst in the mornings, particularly before meals.  Her appetite is excellent.   Denies any bleeding. Remains on Eliquis.   Labs from today reviewed in detail.  WBCs 1.6; ANC 800.  Hgb 10.9 g/dL.  These labs do not satisfy parameters for treatment.  Neutropenic precautions reviewed.      REVIEW OF SYSTEMS:  Review of Systems  Constitutional: Positive for fatigue.  Gastrointestinal: Positive for constipation.  Neurological: Positive for dizziness.  All other systems reviewed and are negative.    PAST MEDICAL/SURGICAL HISTORY:  Past Medical History:  Diagnosis Date  . H/O seasonal allergies   . Hypertension   . Thrombosis    in right arm developed after 2nd round of chemotherapy   Past Surgical History:  Procedure Laterality Date  . BUNIONECTOMY Bilateral   . CYSTECTOMY     removed from left breast  . MELANOMA EXCISION Left    posterior forearm  . PORTA CATH INSERTION    . TUBAL LIGATION       SOCIAL HISTORY:  Social History   Socioeconomic History  . Marital status: Divorced    Spouse name: Not on file  . Number of children: Not on file  . Years of education: Not on file  . Highest education level: Not on file  Occupational History  . Not on file  Social Needs  . Financial resource strain: Not on file  . Food insecurity:    Worry: Not on file    Inability: Not on file  . Transportation needs:    Medical: Not on file    Non-medical: Not on file  Tobacco Use  . Smoking status: Never Smoker  . Smokeless tobacco: Never Used  Substance and Sexual Activity  . Alcohol use: Never    Frequency: Never  . Drug use: Never  . Sexual activity: Not Currently  Lifestyle  . Physical activity:    Days per week: Not on file    Minutes per session: Not on file  . Stress: Not on file  Relationships  . Social connections:    Talks on phone: Not on file    Gets together: Not on file    Attends religious service: Not on file    Active member of  club or organization: Not on file    Attends meetings of clubs or organizations: Not on file    Relationship status: Not on file  . Intimate partner violence:    Fear of current or ex partner: Not on file    Emotionally abused: Not on file    Physically abused: Not on file    Forced sexual activity: Not on file  Other Topics Concern  . Not on file  Social History Narrative  . Not on file    FAMILY HISTORY:  History reviewed. No pertinent family history.  CURRENT MEDICATIONS:  Outpatient Encounter Medications as of 01/03/2018  Medication Sig  . amLODipine (NORVASC) 5 MG tablet Take 1 tablet by mouth daily.  Marland Kitchen atorvastatin (LIPITOR) 10 MG tablet Take 1 tablet by mouth daily.  Marland Kitchen  CALCIUM PO Take 2 tablets by mouth daily.  Marland Kitchen ELIQUIS 5 MG TABS tablet Take 5 mg by mouth 2 (two) times daily.  . fexofenadine (ALLEGRA) 60 MG tablet Take 1 tablet by mouth daily as needed.  . hydrochlorothiazide (HYDRODIURIL) 25 MG tablet Take 1 tablet by mouth daily.  Marland Kitchen ibuprofen (ADVIL,MOTRIN) 200 MG tablet Take 2 tablets by mouth 3 (three) times daily as needed.  Marland Kitchen KLOR-CON M20 20 MEQ tablet Take 20 mEq by mouth daily.  . ondansetron (ZOFRAN) 4 MG tablet Take 4 mg by mouth 3 (three) times daily as needed.  Marland Kitchen oxyCODONE-acetaminophen (PERCOCET/ROXICET) 5-325 MG tablet Take 1-2 tablets by mouth every 4 (four) hours as needed. for pain  . prochlorperazine (COMPAZINE) 10 MG tablet Take 10 mg by mouth every 6 (six) hours as needed.  . ramipril (ALTACE) 5 MG capsule Take 1 capsule by mouth daily.  . Vitamin D, Ergocalciferol, (DRISDOL) 50000 units CAPS capsule Take 1 capsule by mouth once a week. On Mondays   No facility-administered encounter medications on file as of 01/03/2018.     ALLERGIES:  Allergies  Allergen Reactions  . Penicillin G      PHYSICAL EXAM:  ECOG Performance status: 1  I have reviewed vital signs.   LABORATORY DATA:  I have reviewed the labs as listed.  CBC    Component Value  Date/Time   WBC 1.6 (L) 01/03/2018 0917   RBC 3.98 (L) 01/03/2018 0917   HGB 10.9 (L) 01/03/2018 0917   HCT 34.1 (L) 01/03/2018 0917   PLT 308 01/03/2018 0917   MCV 85.7 01/03/2018 0917   MCH 27.4 01/03/2018 0917   MCHC 32.0 01/03/2018 0917   RDW 15.5 01/03/2018 0917   LYMPHSABS 0.3 (L) 01/03/2018 0917   MONOABS 0.4 01/03/2018 0917   EOSABS 0.0 01/03/2018 0917   BASOSABS 0.0 01/03/2018 0917   CMP Latest Ref Rng & Units 01/03/2018 12/27/2017 12/20/2017  Glucose 65 - 99 mg/dL 96 89 120(H)  BUN 6 - 20 mg/dL 12 12 10   Creatinine 0.61 - 1.24 mg/dL 0.54(L) 0.55(L) 0.64  Sodium 135 - 145 mmol/L 138 136 140  Potassium 3.5 - 5.1 mmol/L 3.7 3.5 3.8  Chloride 101 - 111 mmol/L 102 101 104  CO2 22 - 32 mmol/L 25 27 25   Calcium 8.9 - 10.3 mg/dL 9.8 9.5 10.0  Total Protein 6.5 - 8.1 g/dL 7.1 6.9 7.1  Total Bilirubin 0.3 - 1.2 mg/dL 0.9 0.7 0.4  Alkaline Phos 38 - 126 U/L 71 75 99  AST 15 - 41 U/L 22 20 18   ALT 17 - 63 U/L 40 32 23       ASSESSMENT & PLAN:   Malignant neoplasm of upper-outer quadrant of left breast in female, estrogen receptor negative ([place]) 1.  Clinical stage IIIc left breast TNBC: She has tolerated 4 cycles of dose dense AC very well.  She only had minor nausea but denied any vomiting.  She had some constipation but denied any diarrhea.  No signs or symptoms of PND or orthopnea.  Today she will proceed with weekly paclitaxel.  I have discussed the results from 3 large randomized clinical trials including CALGB 82505, [place] GeparSixto trial, and [facility] trial which have demonstrated improvement in pathological complete response (pCR) rates by adding carboplatin to the paclitaxel regimen.  Carboplatin can be added either at once every 3 weeks (AUC 6) or weekly along with Taxol (AUC 2).  It is associated with increased hematological toxicity.  By adding carboplatin,  pCR rates as high as 55-60% were reported.  This is directly correlated with survival.  Benefit was seen in both  BRCA positive and negative patients.  Hence we started her on weekly carboplatin and paclitaxel.  She tolerated 2 cycles very well.  She denied any nausea, vomiting, diarrhea or peripheral neuropathy.  Her appetite has been great.  Energy levels are better.  However today had a white count is 1.8.  ANC is 800.  We will delay her chemotherapy by 1 week.  During week 2 I have reduced dose of carboplatin secondary to mildly decreased white count.  She might need intermittent Neupogen injections to keep her on schedule.  2.  Genetic testing: She had extensive family history of malignancies.  We have done genetic testing for commonly inheritable genetic mutations, it was reported negative.      Orders placed this encounter:  No orders of the defined types were placed in this encounter.     Derek Jack, MD [place] [phone]

## 2018-01-03 NOTE — Progress Notes (Signed)
See chemo administration for documentation.

## 2018-01-09 ENCOUNTER — Other Ambulatory Visit (HOSPITAL_COMMUNITY): Payer: Self-pay

## 2018-01-09 DIAGNOSIS — C50412 Malignant neoplasm of upper-outer quadrant of left female breast: Secondary | ICD-10-CM

## 2018-01-09 DIAGNOSIS — Z171 Estrogen receptor negative status [ER-]: Principal | ICD-10-CM

## 2018-01-10 ENCOUNTER — Inpatient Hospital Stay (HOSPITAL_COMMUNITY): Payer: Managed Care, Other (non HMO) | Attending: Hematology

## 2018-01-10 ENCOUNTER — Encounter (HOSPITAL_COMMUNITY): Payer: Self-pay

## 2018-01-10 ENCOUNTER — Inpatient Hospital Stay (HOSPITAL_COMMUNITY): Payer: Managed Care, Other (non HMO)

## 2018-01-10 VITALS — BP 133/74 | HR 72 | Temp 98.1°F | Resp 18 | Wt 182.8 lb

## 2018-01-10 DIAGNOSIS — Z79899 Other long term (current) drug therapy: Secondary | ICD-10-CM | POA: Insufficient documentation

## 2018-01-10 DIAGNOSIS — K5900 Constipation, unspecified: Secondary | ICD-10-CM | POA: Diagnosis not present

## 2018-01-10 DIAGNOSIS — Z171 Estrogen receptor negative status [ER-]: Principal | ICD-10-CM

## 2018-01-10 DIAGNOSIS — Z7901 Long term (current) use of anticoagulants: Secondary | ICD-10-CM | POA: Insufficient documentation

## 2018-01-10 DIAGNOSIS — C50412 Malignant neoplasm of upper-outer quadrant of left female breast: Secondary | ICD-10-CM

## 2018-01-10 DIAGNOSIS — Z5111 Encounter for antineoplastic chemotherapy: Secondary | ICD-10-CM | POA: Insufficient documentation

## 2018-01-10 DIAGNOSIS — R42 Dizziness and giddiness: Secondary | ICD-10-CM | POA: Insufficient documentation

## 2018-01-10 DIAGNOSIS — Z803 Family history of malignant neoplasm of breast: Secondary | ICD-10-CM | POA: Insufficient documentation

## 2018-01-10 DIAGNOSIS — R5383 Other fatigue: Secondary | ICD-10-CM | POA: Diagnosis not present

## 2018-01-10 DIAGNOSIS — D701 Agranulocytosis secondary to cancer chemotherapy: Secondary | ICD-10-CM | POA: Insufficient documentation

## 2018-01-10 DIAGNOSIS — T451X5S Adverse effect of antineoplastic and immunosuppressive drugs, sequela: Secondary | ICD-10-CM | POA: Diagnosis not present

## 2018-01-10 MED ORDER — SODIUM CHLORIDE 0.9% FLUSH
10.0000 mL | INTRAVENOUS | Status: DC | PRN
  Administered 2018-01-10: 10 mL
  Filled 2018-01-10: qty 10

## 2018-01-10 MED ORDER — PACLITAXEL CHEMO INJECTION 300 MG/50ML
80.0000 mg/m2 | Freq: Once | INTRAVENOUS | Status: AC
  Administered 2018-01-10: 150 mg via INTRAVENOUS
  Filled 2018-01-10: qty 25

## 2018-01-10 MED ORDER — FAMOTIDINE IN NACL 20-0.9 MG/50ML-% IV SOLN
20.0000 mg | Freq: Once | INTRAVENOUS | Status: AC
  Administered 2018-01-10: 20 mg via INTRAVENOUS
  Filled 2018-01-10: qty 50

## 2018-01-10 MED ORDER — PALONOSETRON HCL INJECTION 0.25 MG/5ML
0.2500 mg | Freq: Once | INTRAVENOUS | Status: AC
  Administered 2018-01-10: 0.25 mg via INTRAVENOUS
  Filled 2018-01-10: qty 5

## 2018-01-10 MED ORDER — SODIUM CHLORIDE 0.9 % IV SOLN
Freq: Once | INTRAVENOUS | Status: AC
  Administered 2018-01-10: 500 mL via INTRAVENOUS

## 2018-01-10 MED ORDER — DIPHENHYDRAMINE HCL 50 MG/ML IJ SOLN
50.0000 mg | Freq: Once | INTRAMUSCULAR | Status: AC
  Administered 2018-01-10: 50 mg via INTRAVENOUS
  Filled 2018-01-10: qty 1

## 2018-01-10 MED ORDER — SODIUM CHLORIDE 0.9 % IV SOLN
20.0000 mg | Freq: Once | INTRAVENOUS | Status: AC
  Administered 2018-01-10: 20 mg via INTRAVENOUS
  Filled 2018-01-10: qty 2

## 2018-01-10 MED ORDER — HEPARIN SOD (PORK) LOCK FLUSH 100 UNIT/ML IV SOLN
500.0000 [IU] | Freq: Once | INTRAVENOUS | Status: AC | PRN
  Administered 2018-01-10: 500 [IU]

## 2018-01-10 NOTE — Progress Notes (Signed)
Reviewed lab work with Dr. Delton Coombes for [place] 1.1 today.  Ok to treat with Taxol only today if the patient is doing well and to hold the Carboplatin.  Pharmacy called and notified.    Patient tolerated chemotherapy with no complaints voiced.  Port site clean and dry with no bruising or swelling noted at site.  No complaints of pain with flush.  Good blood return noted before and after administration of chemotherapy. Band aid applied.  VSs with discharge and left ambulatory with family with no s/s of distress noted.  Reviewed neutropenia precautions with the patient and family with understanding verbalized.

## 2018-01-10 NOTE — Patient Instructions (Signed)
[facility] Discharge Instructions for Patients Receiving Chemotherapy  Today you received the following chemotherapy agents taxol.    Neutropenia Neutropenia is a condition that occurs when you have a lower-than-normal level of a type of white blood cell (neutrophil) in your body. Neutrophils are made in the spongy center of large bones (bone marrow) and they fight infections. Neutrophils are your body's main defense against bacterial and fungal infections. The fewer neutrophils you have and the longer your body remains without them, the greater your risk of getting a severe infection. What are the causes? This condition can occur if your body uses up or destroys neutrophils faster than your bone marrow can make them. This problem may happen because of:  Bacterial or fungal infection.  Allergic disorders.  Reactions to some medicines.  Autoimmune disease.  An enlarged spleen.  This condition can also occur if your bone marrow does not produce enough neutrophils. This problem may be caused by:  Cancer.  Cancer treatments, such as radiation or chemotherapy.  Viral infections.  Medicines, such as phenytoin.  Vitamin B12 deficiency.  Diseases of the bone marrow.  Environmental toxins, such as insecticides.  What are the signs or symptoms? This condition does not usually cause symptoms. If symptoms are present, they are usually caused by an underlying infection. Symptoms of an infection may include:  Fever.  Chills.  Swollen glands.  Oral or anal ulcers.  Cough and shortness of breath.  Rash.  Skin infection.  Fatigue.  How is this diagnosed? Your health care provider may suspect neutropenia if you have:  A condition that may cause neutropenia.  Symptoms of infection, especially fever.  Frequent and unusual infections.  You will have a medical history and physical exam. Tests will also be done, such as:  A complete blood count (CBC).  A  procedure to collect a sample of bone marrow for examination (bone marrow biopsy).  A chest X-ray.  A urine culture.  A blood culture.  How is this treated? Treatment depends on the underlying cause and severity of your condition. Mild neutropenia may not require treatment. Treatment may include medicines, such as:  Antibiotic medicine given through an IV tube.  Antiviral medicines.  Antifungal medicines.  A medicine to increase neutrophil production (colony-stimulating factor). You may get this drug through an IV tube or by injection.  Steroids given through an IV tube.  If an underlying condition is causing neutropenia, you may need treatment for that condition. If medicines you are taking are causing neutropenia, your health care provider may have you stop taking those medicines. Follow these instructions at home: Medicines  Take over-the-counter and prescription medicines only as told by your health care provider.  Get a seasonal flu shot (influenza vaccine). Lifestyle  Do not eat unpasteurized foods.Do not eat unwashed raw fruits or vegetables.  Avoid exposure to groups of people or children.  Avoid being around people who are sick.  Avoid being around dirt or dust, such as in construction areas or gardens.  Do not provide direct care for pets. Avoid animal droppings. Do not clean litter boxes and bird cages. Hygiene   Bathe daily.  Clean the area between the genitals and the anus (perineal area) after you urinate or have a bowel movement. If you are female, wipe from front to back.  Brush your teeth with a soft toothbrush before and after meals.  Do not use a razor that has a blade. Use an electric razor to remove hair.  Wash your hands often. Make sure others who come in contact with you also wash their hands. If soap and water are not available, use hand sanitizer. General instructions  Do not have sex unless your health care provider has approved.  Take  actions to avoid cuts and burns. For example: ? Be cautious when you use knives. Always cut away from yourself. ? Keep knives in protective sheaths or guards when not in use. ? Use oven mitts when you cook with a hot stove, oven, or grill. ? Stand a safe distance away from open fires.  Avoid people who received a vaccine in the past 30 days if that vaccine contained a live version of the germ (live vaccine). You should not get a live vaccine. Common live vaccines are varicella, measles, mumps, and rubella.  Do not share food utensils.  Do not use tampons, enemas, or rectal suppositories unless your health care provider has approved.  Keep all appointments as told by your health care provider. This is important. Contact a health care provider if:  You have a fever.  You have chills or you start to shake.  You have: ? A sore throat. ? A warm, red, or tender area on your skin. ? A cough. ? Frequent or painful urination. ? Vaginal discharge or itching.  You develop: ? Sores in your mouth or anus. ? Swollen lymph nodes. ? Red streaks on the skin. ? A rash.  You feel: ? Nauseous or you vomit. ? Very fatigued. ? Short of breath. This information is not intended to replace advice given to you by your health care provider. Make sure you discuss any questions you have with your health care provider. Document Released: 02/17/2002 Document Revised: 02/03/2016 Document Reviewed: 03/10/2015 Elsevier Interactive Patient Education  [employer].    If you develop nausea and vomiting that is not controlled by your nausea medication, call the clinic.   BELOW ARE SYMPTOMS THAT SHOULD BE REPORTED IMMEDIATELY:  *FEVER GREATER THAN 100.5 F  *CHILLS WITH OR WITHOUT FEVER  NAUSEA AND VOMITING THAT IS NOT CONTROLLED WITH YOUR NAUSEA MEDICATION  *UNUSUAL SHORTNESS OF BREATH  *UNUSUAL BRUISING OR BLEEDING  TENDERNESS IN MOUTH AND THROAT WITH OR WITHOUT PRESENCE OF ULCERS  *URINARY  PROBLEMS  *BOWEL PROBLEMS  UNUSUAL RASH Items with * indicate a potential emergency and should be followed up as soon as possible.  Feel free to call the clinic should you have any questions or concerns. The clinic phone number is [phone].  Please show the [facility] at check-in to the Emergency Department and triage nurse.

## 2018-01-17 ENCOUNTER — Inpatient Hospital Stay (HOSPITAL_COMMUNITY): Payer: Managed Care, Other (non HMO)

## 2018-01-17 ENCOUNTER — Inpatient Hospital Stay (HOSPITAL_BASED_OUTPATIENT_CLINIC_OR_DEPARTMENT_OTHER): Payer: Managed Care, Other (non HMO) | Admitting: Hematology

## 2018-01-17 ENCOUNTER — Encounter (HOSPITAL_COMMUNITY): Payer: Self-pay | Admitting: Hematology

## 2018-01-17 ENCOUNTER — Other Ambulatory Visit: Payer: Self-pay

## 2018-01-17 VITALS — BP 130/70 | HR 92 | Temp 97.7°F | Resp 16 | Wt 186.1 lb

## 2018-01-17 DIAGNOSIS — Z79899 Other long term (current) drug therapy: Secondary | ICD-10-CM | POA: Diagnosis not present

## 2018-01-17 DIAGNOSIS — K5900 Constipation, unspecified: Secondary | ICD-10-CM

## 2018-01-17 DIAGNOSIS — T451X5S Adverse effect of antineoplastic and immunosuppressive drugs, sequela: Secondary | ICD-10-CM | POA: Diagnosis not present

## 2018-01-17 DIAGNOSIS — R5383 Other fatigue: Secondary | ICD-10-CM

## 2018-01-17 DIAGNOSIS — D701 Agranulocytosis secondary to cancer chemotherapy: Secondary | ICD-10-CM | POA: Diagnosis not present

## 2018-01-17 DIAGNOSIS — Z171 Estrogen receptor negative status [ER-]: Principal | ICD-10-CM

## 2018-01-17 DIAGNOSIS — Z803 Family history of malignant neoplasm of breast: Secondary | ICD-10-CM

## 2018-01-17 DIAGNOSIS — Z7901 Long term (current) use of anticoagulants: Secondary | ICD-10-CM

## 2018-01-17 DIAGNOSIS — C50412 Malignant neoplasm of upper-outer quadrant of left female breast: Secondary | ICD-10-CM

## 2018-01-17 DIAGNOSIS — R42 Dizziness and giddiness: Secondary | ICD-10-CM

## 2018-01-17 MED ORDER — PALONOSETRON HCL INJECTION 0.25 MG/5ML
INTRAVENOUS | Status: AC
  Filled 2018-01-17: qty 5

## 2018-01-17 MED ORDER — HEPARIN SOD (PORK) LOCK FLUSH 100 UNIT/ML IV SOLN
500.0000 [IU] | Freq: Once | INTRAVENOUS | Status: AC | PRN
  Administered 2018-01-17: 500 [IU]

## 2018-01-17 MED ORDER — PALONOSETRON HCL INJECTION 0.25 MG/5ML
0.2500 mg | Freq: Once | INTRAVENOUS | Status: AC
  Administered 2018-01-17: 0.25 mg via INTRAVENOUS

## 2018-01-17 MED ORDER — SODIUM CHLORIDE 0.9 % IV SOLN
100.0000 mg | Freq: Once | INTRAVENOUS | Status: AC
  Administered 2018-01-17: 100 mg via INTRAVENOUS
  Filled 2018-01-17: qty 10

## 2018-01-17 MED ORDER — PACLITAXEL CHEMO INJECTION 300 MG/50ML
80.0000 mg/m2 | Freq: Once | INTRAVENOUS | Status: AC
  Administered 2018-01-17: 150 mg via INTRAVENOUS
  Filled 2018-01-17: qty 25

## 2018-01-17 MED ORDER — FAMOTIDINE IN NACL 20-0.9 MG/50ML-% IV SOLN
20.0000 mg | Freq: Once | INTRAVENOUS | Status: AC
  Administered 2018-01-17: 20 mg via INTRAVENOUS

## 2018-01-17 MED ORDER — DIPHENHYDRAMINE HCL 50 MG/ML IJ SOLN
INTRAMUSCULAR | Status: AC
  Filled 2018-01-17: qty 1

## 2018-01-17 MED ORDER — SODIUM CHLORIDE 0.9 % IV SOLN
20.0000 mg | Freq: Once | INTRAVENOUS | Status: AC
  Administered 2018-01-17: 20 mg via INTRAVENOUS
  Filled 2018-01-17: qty 2

## 2018-01-17 MED ORDER — DIPHENHYDRAMINE HCL 50 MG/ML IJ SOLN
50.0000 mg | Freq: Once | INTRAMUSCULAR | Status: AC
  Administered 2018-01-17: 50 mg via INTRAVENOUS

## 2018-01-17 MED ORDER — SODIUM CHLORIDE 0.9 % IV SOLN
Freq: Once | INTRAVENOUS | Status: AC
  Administered 2018-01-17: 12:00:00 via INTRAVENOUS

## 2018-01-17 MED ORDER — FAMOTIDINE IN NACL 20-0.9 MG/50ML-% IV SOLN
INTRAVENOUS | Status: AC
  Filled 2018-01-17: qty 50

## 2018-01-17 NOTE — Assessment & Plan Note (Signed)
1.  Clinical stage IIIc left breast TNBC:  - She completed 4 cycles of dose dense AC.  She is currently receiving weekly paclitaxel with carboplatin.  She is here for week 4 of treatment today. -Her ANC is 1500.  She may proceed with same dose paclitaxel.  I will dose reduce carboplatin 200 mg flat.  If we are having problems with neutropenia, we will talk to Dr. Marin Olp at [facility] to see if their clinic can check her CBC one day prior and give Neupogen.  She will come back in 1 week for her next treatment.  I will see her back in 2 weeks for follow-up.  2.  Genetic testing: She had extensive family history of malignancies.  We have done genetic testing for commonly inheritable genetic mutations, it was reported negative.

## 2018-01-17 NOTE — Progress Notes (Signed)
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  CURRENT THERAPY: Weekly paclitaxel and carboplatin.  BRIEF ONCOLOGIC HISTORY:    Malignant neoplasm of upper-outer quadrant of left breast in female, estrogen receptor negative ([place])   09/26/2017 Initial Biopsy    Biopsy of the left breast and left axillary lymph node consistent with high-grade TNBC, Ki-67 of 80%  Clinical stage T1cN2 M0      09/26/2017 Family History    Sister had breast cancer at age 14, HER-2 positive, multiple paternal uncles had prostate cancer, father had prostate cancer, 2 paternal cousins had breast cancer       10/11/2017 Echocardiogram    Echocardiogram on 10/11/2017 showed ejection fraction of 65-70%      10/18/2017 Imaging    CT scan of the chest, abdomen and pelvis shows tiny circumscribed noncalcified nodule in the posterior segment of the left upper lobe of the lung with no other evidence of metastatic disease  MRI of the abdomen with and without contrast ordered for elevated total bilirubin on 10/29/2017 shows no evidence of liver metastasis      10/22/2017 Genetic Testing    Myriad myRisk test for BRCA 1 and 2 and other mutations negative      10/23/2017 -  Chemotherapy    The patient had DOXOrubicin (ADRIAMYCIN) chemo injection 114 mg, 60 mg/m2, Intravenous,  Once, 4 of 4 cycles Administration: 114 mg (12/05/2017) palonosetron (ALOXI) injection 0.25 mg, 0.25 mg, Intravenous,  Once, 6 of 8 cycles Administration: 0.25 mg (12/20/2017), 0.25 mg (12/05/2017), 0.25 mg (01/17/2018), 0.25 mg (12/27/2017), 0.25 mg (01/10/2018) pegfilgrastim (NEULASTA ONPRO KIT) injection 6 mg, 6 mg, Subcutaneous, Once, 1 of 1 cycle Administration: 6 mg (12/05/2017) filgrastim (NEUPOGEN) injection 480 mcg, 480 mcg,  Subcutaneous, Once PRN, 1 of 3 cycles pegfilgrastim-cbqv (UDENYCA) injection 6 mg, 6 mg, Subcutaneous, Once, 3 of 3 cycles CARBOplatin (PARAPLATIN) 230 mg in sodium chloride 0.9 % 250 mL chemo infusion, 230 mg (100 % of original dose 230.8 mg), Intravenous,  Once, 2 of 4 cycles Dose modification:   (original dose 230.8 mg, Cycle 5), 100 mg (original dose 100 mg, Cycle 6),   (original dose 230.8 mg, Cycle 5), 184.64 mg (80 % of original dose 230.8 mg, Cycle 5, Reason: Provider Judgment) Administration: 230 mg (12/20/2017), 100 mg (01/17/2018), 180 mg (12/27/2017) cyclophosphamide (CYTOXAN) 1,140 mg in sodium chloride 0.9 % 250 mL chemo infusion, 600 mg/m2, Intravenous,  Once, 4 of 4 cycles Administration: 1,140 mg (12/05/2017) PACLitaxel (TAXOL) 150 mg in dextrose 5 % 250 mL chemo infusion (</= 25m/m2), 80 mg/m2 = 150 mg, Intravenous,  Once, 2 of 4 cycles Administration: 150 mg (12/20/2017), 150 mg (12/27/2017), 150 mg (01/17/2018), 150 mg (01/10/2018) fosaprepitant (EMEND) 150 mg, dexamethasone (DECADRON) 12 mg in sodium chloride 0.9 % 145 mL IVPB, , Intravenous,  Once, 5 of 5 cycles Administration:  (12/20/2017),  (12/05/2017)  for chemotherapy treatment.       10/24/2017 -  Neo-Adjuvant Chemotherapy    Dose dense AC for 4 cycles followed by weekly carboplatin and paclitaxel for 12 weeks        CANCER STAGING: Cancer Staging Malignant neoplasm of upper-outer quadrant of left breast in female, estrogen receptor negative ([place] Staging form: Breast, AJCC 8th Edition - Clinical: Stage IIIC (cT1, cN2, cM0, G3, ER:  Negative, PR: Negative, HER2: Negative) - Signed by Holley Bouche, NP on 11/28/2017     INTERVAL HISTORY:  Mr. Debruyn 67 y.o. female returns for routine follow-up and consideration for next cycle of chemotherapy.   Here today with family.   Carboplatin dose-reduced on cycle #3.  Carboplatin was held last week d/t neutropenia. She received Taxol alone.   Denies mucositis, diarrhea. She  has occasional constipation; prune juice helps.  Appetite is okay. Overall, she feels well.   Will plan to further dose-reduce her Carboplatin today; she may need additional growth factor support in the future.     REVIEW OF SYSTEMS:  Review of Systems  Constitutional: Positive for fatigue.  Gastrointestinal: Positive for constipation.  Neurological: Positive for dizziness.  All other systems reviewed and are negative.    PAST MEDICAL/SURGICAL HISTORY:  Past Medical History:  Diagnosis Date  . H/O seasonal allergies   . Hypertension   . Thrombosis    in right arm developed after 2nd round of chemotherapy   Past Surgical History:  Procedure Laterality Date  . BUNIONECTOMY Bilateral   . CYSTECTOMY     removed from left breast  . MELANOMA EXCISION Left    posterior forearm  . PORTA CATH INSERTION    . TUBAL LIGATION       SOCIAL HISTORY:  Social History   Socioeconomic History  . Marital status: Divorced    Spouse name: Not on file  . Number of children: Not on file  . Years of education: Not on file  . Highest education level: Not on file  Occupational History  . Not on file  Social Needs  . Financial resource strain: Not on file  . Food insecurity:    Worry: Not on file    Inability: Not on file  . Transportation needs:    Medical: Not on file    Non-medical: Not on file  Tobacco Use  . Smoking status: Never Smoker  . Smokeless tobacco: Never Used  Substance and Sexual Activity  . Alcohol use: Never    Frequency: Never  . Drug use: Never  . Sexual activity: Not Currently  Lifestyle  . Physical activity:    Days per week: Not on file    Minutes per session: Not on file  . Stress: Not on file  Relationships  . Social connections:    Talks on phone: Not on file    Gets together: Not on file    Attends religious service: Not on file    Active member of club or organization: Not on file    Attends meetings of clubs or organizations: Not on file     Relationship status: Not on file  . Intimate partner violence:    Fear of current or ex partner: Not on file    Emotionally abused: Not on file    Physically abused: Not on file    Forced sexual activity: Not on file  Other Topics Concern  . Not on file  Social History Narrative  . Not on file    FAMILY HISTORY:  History reviewed. No pertinent family history.  CURRENT MEDICATIONS:  Outpatient Encounter Medications as of 01/17/2018  Medication Sig  . amLODipine (NORVASC) 5 MG tablet Take 1 tablet by mouth daily.  Marland Kitchen atorvastatin (LIPITOR) 10 MG tablet Take 1 tablet by mouth daily.  Marland Kitchen CALCIUM PO Take 2 tablets by mouth daily.  Marland Kitchen ELIQUIS 5 MG TABS tablet Take 5 mg by mouth 2 (two) times  daily.  . fexofenadine (ALLEGRA) 60 MG tablet Take 1 tablet by mouth daily as needed.  . hydrochlorothiazide (HYDRODIURIL) 25 MG tablet Take 1 tablet by mouth daily.  Marland Kitchen ibuprofen (ADVIL,MOTRIN) 200 MG tablet Take 2 tablets by mouth 3 (three) times daily as needed.  Marland Kitchen KLOR-CON M20 20 MEQ tablet Take 20 mEq by mouth daily.  . ondansetron (ZOFRAN) 4 MG tablet Take 4 mg by mouth 3 (three) times daily as needed.  Marland Kitchen oxyCODONE-acetaminophen (PERCOCET/ROXICET) 5-325 MG tablet Take 1-2 tablets by mouth every 4 (four) hours as needed. for pain  . prochlorperazine (COMPAZINE) 10 MG tablet Take 10 mg by mouth every 6 (six) hours as needed.  . ramipril (ALTACE) 5 MG capsule Take 1 capsule by mouth daily.  . Vitamin D, Ergocalciferol, (DRISDOL) 50000 units CAPS capsule Take 1 capsule by mouth once a week. On Mondays   No facility-administered encounter medications on file as of 01/17/2018.     ALLERGIES:  Allergies  Allergen Reactions  . Penicillin G      PHYSICAL EXAM:  ECOG Performance status: 1  I have reviewed vital signs.   LABORATORY DATA:  I have reviewed the labs as listed.  CBC    Component Value Date/Time   WBC 2.4 (L) 01/17/2018 0952   RBC 4.15 (L) 01/17/2018 0952   HGB 11.4 (L) 01/17/2018  0952   HCT 35.9 (L) 01/17/2018 0952   PLT 227 01/17/2018 0952   MCV 86.5 01/17/2018 0952   MCH 27.5 01/17/2018 0952   MCHC 31.8 01/17/2018 0952   RDW 14.8 01/17/2018 0952   LYMPHSABS 0.6 (L) 01/17/2018 0952   MONOABS 0.2 01/17/2018 0952   EOSABS 0.1 01/17/2018 0952   BASOSABS 0.0 01/17/2018 0952   CMP Latest Ref Rng & Units 01/17/2018 01/10/2018 01/03/2018  Glucose 65 - 99 mg/dL 106(H) 101(H) 96  BUN 6 - 20 mg/dL _0 Creatinine 0.61 - 1.24 mg/dL 0.58(L) 0.65 0.54(L)  Sodium 135 - 145 mmol/L 137 138 138  Potassium 3.5 - 5.1 mmol/L 4.0 3.6 3.7  Chloride 101 - 111 mmol/L 101 101 102  CO2 22 - 32 mmol/L _1 Calcium 8.9 - 10.3 mg/dL 9.9 9.9 9.8  Total Protein 6.5 - 8.1 g/dL 7.3 7.5 7.1  Total Bilirubin 0.3 - 1.2 mg/dL 1.0 1.1 0.9  Alkaline Phos 38 - 126 U/L 72 79 71  AST 15 - 41 U/L _2 ALT 17 - 63 U/L 34 34 40       ASSESSMENT & PLAN:   Malignant neoplasm of upper-outer quadrant of female breast ([place]) 1.  Clinical stage IIIc left breast TNBC:  - She completed 4 cycles of dose dense AC.  She is currently receiving weekly paclitaxel with carboplatin.  She is here for week 4 of treatment today. -Her ANC is 1500.  She may proceed with same dose paclitaxel.  I will dose reduce carboplatin 200 mg flat.  If we are having problems with neutropenia, we will talk to Dr. Marin Olp at [facility] to see if their clinic can check her CBC one day prior and give Neupogen.  She will come back in 1 week for her next treatment.  I will see her back in 2 weeks for follow-up.  2.  Genetic testing: She had extensive family history of malignancies.  We have done genetic testing for commonly inheritable genetic mutations, it was reported negative.      Orders placed this encounter:  No orders of  the defined types were placed in this encounter.     Derek Jack, MD [place] [phone]

## 2018-01-17 NOTE — Progress Notes (Signed)
Tolerated infusions w/o adverse reaction.  Alert, in no distress.  VSS.  Discharged ambulatory in c/o family.  

## 2018-01-22 ENCOUNTER — Other Ambulatory Visit (HOSPITAL_COMMUNITY): Payer: Self-pay

## 2018-01-22 DIAGNOSIS — C50412 Malignant neoplasm of upper-outer quadrant of left female breast: Secondary | ICD-10-CM

## 2018-01-22 DIAGNOSIS — Z171 Estrogen receptor negative status [ER-]: Principal | ICD-10-CM

## 2018-01-25 ENCOUNTER — Other Ambulatory Visit: Payer: Self-pay

## 2018-01-25 ENCOUNTER — Inpatient Hospital Stay (HOSPITAL_COMMUNITY): Payer: Managed Care, Other (non HMO)

## 2018-01-25 ENCOUNTER — Encounter (HOSPITAL_COMMUNITY): Payer: Self-pay

## 2018-01-25 VITALS — BP 121/69 | HR 73 | Temp 98.4°F | Resp 18 | Wt 185.0 lb

## 2018-01-25 DIAGNOSIS — I82621 Acute embolism and thrombosis of deep veins of right upper extremity: Secondary | ICD-10-CM

## 2018-01-25 DIAGNOSIS — Z171 Estrogen receptor negative status [ER-]: Principal | ICD-10-CM

## 2018-01-25 DIAGNOSIS — C50412 Malignant neoplasm of upper-outer quadrant of left female breast: Secondary | ICD-10-CM

## 2018-01-25 LAB — COMPREHENSIVE METABOLIC PANEL
ALBUMIN: 3.9 g/dL (ref 3.5–5.0)
ALBUMIN: 4.4 g/dL (ref 3.5–5.0)
ALBUMIN: 4.2 g/dL (ref 3.5–5.0)
ALBUMIN: 4.1 g/dL (ref 3.5–5.0)
ALK PHOS: 75 U/L (ref 38–126)
ALK PHOS: 72 U/L (ref 38–126)
ALT: 32 U/L (ref 14–54)
ALT: 40 U/L (ref 14–54)
ALT: 34 U/L (ref 14–54)
ALT: 34 U/L (ref 14–54)
ALT: 38 U/L (ref 14–54)
ANION GAP: 8 (ref 5–15)
ANION GAP: 11 (ref 5–15)
ANION GAP: 7 (ref 5–15)
ANION GAP: 6 (ref 5–15)
AST: 20 U/L (ref 15–41)
AST: 22 U/L (ref 15–41)
AST: 24 U/L (ref 15–41)
AST: 22 U/L (ref 15–41)
AST: 20 U/L (ref 15–41)
Albumin: 4 g/dL (ref 3.5–5.0)
Alkaline Phosphatase: 71 U/L (ref 38–126)
Alkaline Phosphatase: 79 U/L (ref 38–126)
Alkaline Phosphatase: 68 U/L (ref 38–126)
Anion gap: 11 (ref 5–15)
BILIRUBIN TOTAL: 1.1 mg/dL (ref 0.3–1.2)
BILIRUBIN TOTAL: 1.2 mg/dL (ref 0.3–1.2)
BUN: 12 mg/dL (ref 6–20)
BUN: 12 mg/dL (ref 6–20)
BUN: 10 mg/dL (ref 6–20)
BUN: 12 mg/dL (ref 6–20)
BUN: 13 mg/dL (ref 6–20)
CALCIUM: 9.5 mg/dL (ref 8.9–10.3)
CALCIUM: 9.8 mg/dL (ref 8.9–10.3)
CHLORIDE: 101 mmol/L (ref 101–111)
CHLORIDE: 102 mmol/L (ref 101–111)
CHLORIDE: 101 mmol/L (ref 101–111)
CHLORIDE: 101 mmol/L (ref 101–111)
CO2: 27 mmol/L (ref 22–32)
CO2: 25 mmol/L (ref 22–32)
CO2: 26 mmol/L (ref 22–32)
CO2: 29 mmol/L (ref 22–32)
CO2: 30 mmol/L (ref 22–32)
CREATININE: 0.58 mg/dL (ref 0.44–1.00)
Calcium: 9.9 mg/dL (ref 8.9–10.3)
Calcium: 9.9 mg/dL (ref 8.9–10.3)
Calcium: 9.9 mg/dL (ref 8.9–10.3)
Chloride: 100 mmol/L — ABNORMAL LOW (ref 101–111)
Creatinine, Ser: 0.55 mg/dL (ref 0.44–1.00)
Creatinine, Ser: 0.54 mg/dL (ref 0.44–1.00)
Creatinine, Ser: 0.65 mg/dL (ref 0.44–1.00)
Creatinine, Ser: 0.77 mg/dL (ref 0.44–1.00)
GFR calc Af Amer: >60 mL/min (ref >60)
GFR calc Af Amer: >60 mL/min (ref >60)
GFR calc Af Amer: >60 mL/min (ref >60)
GFR calc Af Amer: >60 mL/min (ref >60)
GFR calc non Af Amer: >60 mL/min (ref >60)
GFR calc non Af Amer: >60 mL/min (ref >60)
GFR calc non Af Amer: >60 mL/min (ref >60)
GLUCOSE: 89 mg/dL (ref 65–99)
GLUCOSE: 101 mg/dL — AB (ref 65–99)
GLUCOSE: 106 mg/dL — AB (ref 65–99)
Glucose, Bld: 96 mg/dL (ref 65–99)
Glucose, Bld: 104 mg/dL — ABNORMAL HIGH (ref 65–99)
POTASSIUM: 3.6 mmol/L (ref 3.5–5.1)
POTASSIUM: 3.6 mmol/L (ref 3.5–5.1)
Potassium: 3.5 mmol/L (ref 3.5–5.1)
Potassium: 3.7 mmol/L (ref 3.5–5.1)
Potassium: 4 mmol/L (ref 3.5–5.1)
SODIUM: 136 mmol/L (ref 135–145)
SODIUM: 138 mmol/L (ref 135–145)
SODIUM: 138 mmol/L (ref 135–145)
SODIUM: 137 mmol/L (ref 135–145)
Sodium: 136 mmol/L (ref 135–145)
TOTAL PROTEIN: 7.5 g/dL (ref 6.5–8.1)
TOTAL PROTEIN: 7.2 g/dL (ref 6.5–8.1)
Total Bilirubin: 0.7 mg/dL (ref 0.3–1.2)
Total Bilirubin: 0.9 mg/dL (ref 0.3–1.2)
Total Bilirubin: 1 mg/dL (ref 0.3–1.2)
Total Protein: 6.9 g/dL (ref 6.5–8.1)
Total Protein: 7.1 g/dL (ref 6.5–8.1)
Total Protein: 7.3 g/dL (ref 6.5–8.1)

## 2018-01-25 LAB — CBC WITH DIFFERENTIAL/PLATELET
BASOS ABS: 0 10*3/uL (ref 0.0–0.1)
BASOS PCT: 2 %
BASOS PCT: 2 %
BASOS PCT: 0 %
BASOS PCT: 1 %
Basophils Absolute: 0 10*3/uL (ref 0.0–0.1)
Basophils Absolute: 0 10*3/uL (ref 0.0–0.1)
Basophils Absolute: 0 10*3/uL (ref 0.0–0.1)
Basophils Absolute: 0 10*3/uL (ref 0.0–0.1)
Basophils Relative: 1 %
EOS ABS: 0 10*3/uL (ref 0.0–0.7)
EOS ABS: 0.1 10*3/uL (ref 0.0–0.7)
EOS PCT: 3 %
EOS PCT: 3 %
EOS PCT: 3 %
EOS PCT: 4 %
Eosinophils Absolute: 0 10*3/uL (ref 0.0–0.7)
Eosinophils Absolute: 0.1 10*3/uL (ref 0.0–0.7)
Eosinophils Absolute: 0.1 10*3/uL (ref 0.0–0.7)
Eosinophils Relative: 1 %
HCT: 34.1 % — ABNORMAL LOW (ref 36.0–46.0)
HCT: 37 % (ref 36.0–46.0)
HCT: 35.9 % — ABNORMAL LOW (ref 36.0–46.0)
HEMATOCRIT: 32.6 % — AB (ref 36.0–46.0)
HEMATOCRIT: 35.9 % — AB (ref 36.0–46.0)
HEMOGLOBIN: 10.5 g/dL — AB (ref 12.0–15.0)
HEMOGLOBIN: 11.4 g/dL — AB (ref 12.0–15.0)
Hemoglobin: 10.9 g/dL — ABNORMAL LOW (ref 12.0–15.0)
Hemoglobin: 11.9 g/dL — ABNORMAL LOW (ref 12.0–15.0)
Hemoglobin: 11.5 g/dL — ABNORMAL LOW (ref 12.0–15.0)
LYMPHS ABS: 0.5 10*3/uL — AB (ref 0.7–4.0)
LYMPHS ABS: 0.3 10*3/uL — AB (ref 0.7–4.0)
LYMPHS ABS: 0.6 10*3/uL — AB (ref 0.7–4.0)
Lymphocytes Relative: 21 %
Lymphocytes Relative: 21 %
Lymphocytes Relative: 28 %
Lymphocytes Relative: 25 %
Lymphocytes Relative: 25 %
Lymphs Abs: 0.6 10*3/uL — ABNORMAL LOW (ref 0.7–4.0)
Lymphs Abs: 0.5 10*3/uL — ABNORMAL LOW (ref 0.7–4.0)
MCH: 27.3 pg (ref 26.0–34.0)
MCH: 27.4 pg (ref 26.0–34.0)
MCH: 27.9 pg (ref 26.0–34.0)
MCH: 27.5 pg (ref 26.0–34.0)
MCH: 27.8 pg (ref 26.0–34.0)
MCHC: 32.2 g/dL (ref 30.0–36.0)
MCHC: 32 g/dL (ref 30.0–36.0)
MCHC: 32.2 g/dL (ref 30.0–36.0)
MCHC: 31.8 g/dL (ref 30.0–36.0)
MCHC: 32 g/dL (ref 30.0–36.0)
MCV: 84.7 fL (ref 78.0–100.0)
MCV: 85.7 fL (ref 78.0–100.0)
MCV: 86.7 fL (ref 78.0–100.0)
MCV: 86.5 fL (ref 78.0–100.0)
MCV: 86.9 fL (ref 78.0–100.0)
MONO ABS: 0.3 10*3/uL (ref 0.1–1.0)
MONO ABS: 0.4 10*3/uL (ref 0.1–1.0)
MONO ABS: 0.5 10*3/uL (ref 0.1–1.0)
MONO ABS: 0.2 10*3/uL (ref 0.1–1.0)
MONOS PCT: 12 %
MONOS PCT: 24 %
MONOS PCT: 21 %
MONOS PCT: 11 %
Monocytes Absolute: 0.2 10*3/uL (ref 0.1–1.0)
Monocytes Relative: 9 %
NEUTROS ABS: 1.5 10*3/uL — AB (ref 1.7–7.7)
NEUTROS ABS: 1.1 10*3/uL — AB (ref 1.7–7.7)
NEUTROS ABS: 1 10*3/uL — AB (ref 1.7–7.7)
NEUTROS PCT: 64 %
NEUTROS PCT: 63 %
Neutro Abs: 0.8 10*3/uL — ABNORMAL LOW (ref 1.7–7.7)
Neutro Abs: 1.5 10*3/uL — ABNORMAL LOW (ref 1.7–7.7)
Neutrophils Relative %: 51 %
Neutrophils Relative %: 46 %
Neutrophils Relative %: 59 %
PLATELETS: 308 10*3/uL (ref 150–400)
PLATELETS: 246 10*3/uL (ref 150–400)
PLATELETS: 227 10*3/uL (ref 150–400)
PLATELETS: 209 10*3/uL (ref 150–400)
Platelets: 296 10*3/uL (ref 150–400)
RBC: 3.85 MIL/uL — ABNORMAL LOW (ref 3.87–5.11)
RBC: 3.98 MIL/uL (ref 3.87–5.11)
RBC: 4.27 MIL/uL (ref 3.87–5.11)
RBC: 4.15 MIL/uL (ref 3.87–5.11)
RBC: 4.13 MIL/uL (ref 3.87–5.11)
RDW: 14.6 % (ref 11.5–15.5)
RDW: 15.5 % (ref 11.5–15.5)
RDW: 15.5 % (ref 11.5–15.5)
RDW: 14.8 % (ref 11.5–15.5)
RDW: 14.9 % (ref 11.5–15.5)
WBC: 2.4 10*3/uL — ABNORMAL LOW (ref 4.0–10.5)
WBC: 1.6 10*3/uL — ABNORMAL LOW (ref 4.0–10.5)
WBC: 2.3 10*3/uL — ABNORMAL LOW (ref 4.0–10.5)
WBC: 2.4 10*3/uL — AB (ref 4.0–10.5)
WBC: 1.8 10*3/uL — ABNORMAL LOW (ref 4.0–10.5)

## 2018-01-25 MED ORDER — FAMOTIDINE IN NACL 20-0.9 MG/50ML-% IV SOLN
20.0000 mg | Freq: Once | INTRAVENOUS | Status: AC
Start: 2018-01-25 — End: 2018-01-25
  Administered 2018-01-25: 20 mg via INTRAVENOUS

## 2018-01-25 MED ORDER — FILGRASTIM 480 MCG/0.8ML IJ SOSY
480.0000 ug | PREFILLED_SYRINGE | Freq: Once | INTRAMUSCULAR | Status: DC | PRN
  Filled 2018-01-25: qty 0.8

## 2018-01-25 MED ORDER — HEPARIN SOD (PORK) LOCK FLUSH 100 UNIT/ML IV SOLN
500.0000 [IU] | Freq: Once | INTRAVENOUS | Status: AC | PRN
  Administered 2018-01-25: 500 [IU]

## 2018-01-25 MED ORDER — SODIUM CHLORIDE 0.9 % IV SOLN
20.0000 mg | Freq: Once | INTRAVENOUS | Status: AC
  Administered 2018-01-25: 20 mg via INTRAVENOUS
  Filled 2018-01-25: qty 2

## 2018-01-25 MED ORDER — SODIUM CHLORIDE 0.9 % IV SOLN
80.0000 mg/m2 | Freq: Once | INTRAVENOUS | Status: AC
  Administered 2018-01-25: 150 mg via INTRAVENOUS
  Filled 2018-01-25: qty 25

## 2018-01-25 MED ORDER — PALONOSETRON HCL INJECTION 0.25 MG/5ML
0.2500 mg | Freq: Once | INTRAVENOUS | Status: AC
Start: 2018-01-25 — End: 2018-01-25
  Administered 2018-01-25: 0.25 mg via INTRAVENOUS

## 2018-01-25 MED ORDER — DIPHENHYDRAMINE HCL 50 MG/ML IJ SOLN
50.0000 mg | Freq: Once | INTRAMUSCULAR | Status: AC
  Administered 2018-01-25: 50 mg via INTRAVENOUS

## 2018-01-25 MED ORDER — APIXABAN 5 MG PO TABS: 5.0000 mg | ORAL_TABLET | Freq: Two times a day (BID) | ORAL | 1 refills | Status: DC

## 2018-01-25 MED ORDER — SODIUM CHLORIDE 0.9 % IV SOLN
Freq: Once | INTRAVENOUS | Status: AC
  Administered 2018-01-25: 12:00:00 via INTRAVENOUS

## 2018-01-25 NOTE — Progress Notes (Signed)
Tolerated infusion w/o adverse reaction.  Alert, in no distress.  VSS.  Discharged ambulatory in c/o family.  

## 2018-01-25 NOTE — Progress Notes (Signed)
ANC 1.0. CMP WNL. Okay to treat patient with ANC of 1.0 per Dr. Delton Coombes but he only wants to treat today with Taxol. NO Carbo today. Reviewed with Alvester Chou Pharmacist. He said okay.

## 2018-02-01 ENCOUNTER — Other Ambulatory Visit (HOSPITAL_COMMUNITY): Payer: Managed Care, Other (non HMO)

## 2018-02-01 ENCOUNTER — Inpatient Hospital Stay (HOSPITAL_BASED_OUTPATIENT_CLINIC_OR_DEPARTMENT_OTHER): Payer: Managed Care, Other (non HMO) | Admitting: Hematology

## 2018-02-01 ENCOUNTER — Other Ambulatory Visit: Payer: Self-pay

## 2018-02-01 ENCOUNTER — Ambulatory Visit (HOSPITAL_COMMUNITY): Payer: Managed Care, Other (non HMO)

## 2018-02-01 ENCOUNTER — Inpatient Hospital Stay (HOSPITAL_COMMUNITY): Payer: Managed Care, Other (non HMO)

## 2018-02-01 ENCOUNTER — Encounter (HOSPITAL_COMMUNITY): Payer: Self-pay | Admitting: Hematology

## 2018-02-01 DIAGNOSIS — R5383 Other fatigue: Secondary | ICD-10-CM

## 2018-02-01 DIAGNOSIS — Z803 Family history of malignant neoplasm of breast: Secondary | ICD-10-CM

## 2018-02-01 DIAGNOSIS — C50412 Malignant neoplasm of upper-outer quadrant of left female breast: Secondary | ICD-10-CM

## 2018-02-01 DIAGNOSIS — D701 Agranulocytosis secondary to cancer chemotherapy: Secondary | ICD-10-CM

## 2018-02-01 DIAGNOSIS — Z171 Estrogen receptor negative status [ER-]: Principal | ICD-10-CM

## 2018-02-01 DIAGNOSIS — Z7901 Long term (current) use of anticoagulants: Secondary | ICD-10-CM

## 2018-02-01 DIAGNOSIS — T451X5S Adverse effect of antineoplastic and immunosuppressive drugs, sequela: Secondary | ICD-10-CM

## 2018-02-01 DIAGNOSIS — Z79899 Other long term (current) drug therapy: Secondary | ICD-10-CM

## 2018-02-01 LAB — COMPREHENSIVE METABOLIC PANEL
ALK PHOS: 62 U/L (ref 38–126)
ALT: 23 U/L (ref 14–54)
ANION GAP: 6 (ref 5–15)
AST: 18 U/L (ref 15–41)
Albumin: 3.9 g/dL (ref 3.5–5.0)
BUN: 13 mg/dL (ref 6–20)
CALCIUM: 9.8 mg/dL (ref 8.9–10.3)
CHLORIDE: 101 mmol/L (ref 101–111)
CO2: 29 mmol/L (ref 22–32)
Creatinine, Ser: 0.59 mg/dL (ref 0.44–1.00)
GFR calc non Af Amer: >60 mL/min (ref >60)
Glucose, Bld: 105 mg/dL — ABNORMAL HIGH (ref 65–99)
Potassium: 3.7 mmol/L (ref 3.5–5.1)
SODIUM: 136 mmol/L (ref 135–145)
Total Bilirubin: 1.2 mg/dL (ref 0.3–1.2)
Total Protein: 7 g/dL (ref 6.5–8.1)

## 2018-02-01 LAB — CBC WITH DIFFERENTIAL/PLATELET
Basophils Absolute: 0 10*3/uL (ref 0.0–0.1)
Basophils Relative: 1 %
EOS ABS: 0.1 10*3/uL (ref 0.0–0.7)
EOS PCT: 5 %
HCT: 34.7 % — ABNORMAL LOW (ref 36.0–46.0)
Hemoglobin: 11.1 g/dL — ABNORMAL LOW (ref 12.0–15.0)
LYMPHS ABS: 0.3 10*3/uL — AB (ref 0.7–4.0)
Lymphocytes Relative: 26 %
MCH: 28.2 pg (ref 26.0–34.0)
MCHC: 32 g/dL (ref 30.0–36.0)
MCV: 88.1 fL (ref 78.0–100.0)
MONO ABS: 0.2 10*3/uL (ref 0.1–1.0)
MONOS PCT: 14 %
Neutro Abs: 0.7 10*3/uL (ref 1.7–7.7)
Neutrophils Relative %: 54 %
PLATELETS: 216 10*3/uL (ref 150–400)
RBC: 3.94 MIL/uL (ref 3.87–5.11)
RDW: 14.4 % (ref 11.5–15.5)
WBC: 1.3 10*3/uL — CL (ref 4.0–10.5)

## 2018-02-01 NOTE — Progress Notes (Signed)
CRITICAL VALUE ALERT Critical value received:  WBC-1.3 Date of notification:  02/01/18 Time of notification: 0850 Critical value read back:  Yes.   Nurse who received alert:  M.Roza Creamer, LPN MD notified (1st page):  Tomie China, MD

## 2018-02-01 NOTE — Progress Notes (Signed)
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  CURRENT THERAPY: Weekly paclitaxel and carboplatin.  BRIEF ONCOLOGIC HISTORY:    Malignant neoplasm of upper-outer quadrant of left breast in female, estrogen receptor negative ([place])   09/26/2017 Initial Biopsy    Biopsy of the left breast and left axillary lymph node consistent with high-grade TNBC, Ki-67 of 80%  Clinical stage T1cN2 M0      09/26/2017 Family History    Sister had breast cancer at age 71, HER-2 positive, multiple paternal uncles had prostate cancer, father had prostate cancer, 2 paternal cousins had breast cancer       10/11/2017 Echocardiogram    Echocardiogram on 10/11/2017 showed ejection fraction of 65-70%      10/18/2017 Imaging    CT scan of the chest, abdomen and pelvis shows tiny circumscribed noncalcified nodule in the posterior segment of the left upper lobe of the lung with no other evidence of metastatic disease  MRI of the abdomen with and without contrast ordered for elevated total bilirubin on 10/29/2017 shows no evidence of liver metastasis      10/22/2017 Genetic Testing    Myriad myRisk test for BRCA 1 and 2 and other mutations negative      10/23/2017 -  Chemotherapy    The patient had DOXOrubicin (ADRIAMYCIN) chemo injection 114 mg, 60 mg/m2, Intravenous,  Once, 4 of 4 cycles Administration: 114 mg (12/05/2017) palonosetron (ALOXI) injection 0.25 mg, 0.25 mg, Intravenous,  Once, 6 of 8 cycles Administration: 0.25 mg (12/20/2017), 0.25 mg (12/05/2017), 0.25 mg (01/17/2018), 0.25 mg (12/27/2017), 0.25 mg (01/10/2018), 0.25 mg (01/25/2018) pegfilgrastim (NEULASTA ONPRO KIT) injection 6 mg, 6 mg, Subcutaneous, Once, 1 of 1 cycle Administration: 6 mg (12/05/2017) filgrastim (NEUPOGEN) injection 480 mcg,  480 mcg, Subcutaneous, Once PRN, 1 of 3 cycles pegfilgrastim-cbqv (UDENYCA) injection 6 mg, 6 mg, Subcutaneous, Once, 3 of 3 cycles CARBOplatin (PARAPLATIN) 230 mg in sodium chloride 0.9 % 250 mL chemo infusion, 230 mg (100 % of original dose 230.8 mg), Intravenous,  Once, 2 of 4 cycles Dose modification:   (original dose 230.8 mg, Cycle 5), 100 mg (original dose 100 mg, Cycle 6),   (original dose 230.8 mg, Cycle 5), 184.64 mg (80 % of original dose 230.8 mg, Cycle 5, Reason: Provider Judgment) Administration: 230 mg (12/20/2017), 100 mg (01/17/2018), 180 mg (12/27/2017) cyclophosphamide (CYTOXAN) 1,140 mg in sodium chloride 0.9 % 250 mL chemo infusion, 600 mg/m2, Intravenous,  Once, 4 of 4 cycles Administration: 1,140 mg (12/05/2017) PACLitaxel (TAXOL) 150 mg in dextrose 5 % 250 mL chemo infusion (</= 58m/m2), 80 mg/m2 = 150 mg, Intravenous,  Once, 2 of 4 cycles Administration: 150 mg (12/20/2017), 150 mg (12/27/2017), 150 mg (01/17/2018), 150 mg (01/10/2018), 150 mg (01/25/2018) fosaprepitant (EMEND) 150 mg, dexamethasone (DECADRON) 12 mg in sodium chloride 0.9 % 145 mL IVPB, , Intravenous,  Once, 5 of 5 cycles Administration:  (12/20/2017),  (12/05/2017)  for chemotherapy treatment.       10/24/2017 -  Neo-Adjuvant Chemotherapy    Dose dense AC for 4 cycles followed by weekly carboplatin and paclitaxel for 12 weeks        CANCER STAGING: Cancer Staging Malignant neoplasm of upper-outer quadrant of left breast in female, estrogen receptor negative ([place] Staging form: Breast, AJCC 8th Edition - Clinical: Stage  IIIC (cT1, cN2, cM0, G3, ER: Negative, PR: Negative, HER2: Negative) - Signed by Holley Bouche, NP on 11/28/2017     INTERVAL HISTORY:  Ms. Vitug 67 y.o. female returns for routine follow-up and consideration for next cycle of chemotherapy.   Here today with family.   Overall, she feels great. Denies any N&V, mucositis, diarrhea, or peripheral neuropathy. Her appetite is excellent.  Her energy levels are good.   Labs are reviewed. Unfortunately, her WBCs are 1.3 and ANC is 700.  We will try to make arrangements for her to get Neulasta injection locally in [facility], [place] where she is currently living with family.  We will touch base with the [facility] team in [facility] to see if they can help accommodate this for her prior to her next cycle of treatment.  Neutropenic precautions reviewed and reinforced.     REVIEW OF SYSTEMS:  Review of Systems  Constitutional: Positive for fatigue.  All other systems reviewed and are negative.    PAST MEDICAL/SURGICAL HISTORY:  Past Medical History:  Diagnosis Date  . H/O seasonal allergies   . Hypertension   . Thrombosis    in right arm developed after 2nd round of chemotherapy   Past Surgical History:  Procedure Laterality Date  . BUNIONECTOMY Bilateral   . CYSTECTOMY     removed from left breast  . MELANOMA EXCISION Left    posterior forearm  . PORTA CATH INSERTION    . TUBAL LIGATION       SOCIAL HISTORY:  Social History   Socioeconomic History  . Marital status: Divorced    Spouse name: Not on file  . Number of children: Not on file  . Years of education: Not on file  . Highest education level: Not on file  Occupational History  . Not on file  Social Needs  . Financial resource strain: Not on file  . Food insecurity:    Worry: Not on file    Inability: Not on file  . Transportation needs:    Medical: Not on file    Non-medical: Not on file  Tobacco Use  . Smoking status: Never Smoker  . Smokeless tobacco: Never Used  Substance and Sexual Activity  . Alcohol use: Never    Frequency: Never  . Drug use: Never  . Sexual activity: Not Currently  Lifestyle  . Physical activity:    Days per week: Not on file    Minutes per session: Not on file  . Stress: Not on file  Relationships  . Social connections:    Talks on phone: Not on file    Gets together: Not on file    Attends  religious service: Not on file    Active member of club or organization: Not on file    Attends meetings of clubs or organizations: Not on file    Relationship status: Not on file  . Intimate partner violence:    Fear of current or ex partner: Not on file    Emotionally abused: Not on file    Physically abused: Not on file    Forced sexual activity: Not on file  Other Topics Concern  . Not on file  Social History Narrative  . Not on file    FAMILY HISTORY:  History reviewed. No pertinent family history.  CURRENT MEDICATIONS:  Outpatient Encounter Medications as of 02/01/2018  Medication Sig  . amLODipine (NORVASC) 5 MG tablet Take 1 tablet by mouth daily.  Marland Kitchen  apixaban (ELIQUIS) 5 MG TABS tablet Take 1 tablet (5 mg total) by mouth 2 (two) times daily.  Marland Kitchen atorvastatin (LIPITOR) 10 MG tablet Take 1 tablet by mouth daily.  Marland Kitchen CALCIUM PO Take 2 tablets by mouth daily.  Marland Kitchen ELIQUIS 5 MG TABS tablet Take 5 mg by mouth 2 (two) times daily.  . fexofenadine (ALLEGRA) 60 MG tablet Take 1 tablet by mouth daily as needed.  . hydrochlorothiazide (HYDRODIURIL) 25 MG tablet Take 1 tablet by mouth daily.  Marland Kitchen ibuprofen (ADVIL,MOTRIN) 200 MG tablet Take 2 tablets by mouth 3 (three) times daily as needed.  Marland Kitchen KLOR-CON M20 20 MEQ tablet Take 20 mEq by mouth daily.  . ondansetron (ZOFRAN) 4 MG tablet Take 4 mg by mouth 3 (three) times daily as needed.  Marland Kitchen oxyCODONE-acetaminophen (PERCOCET/ROXICET) 5-325 MG tablet Take 1-2 tablets by mouth every 4 (four) hours as needed. for pain  . prochlorperazine (COMPAZINE) 10 MG tablet Take 10 mg by mouth every 6 (six) hours as needed.  . ramipril (ALTACE) 5 MG capsule Take 1 capsule by mouth daily.  . Vitamin D, Ergocalciferol, (DRISDOL) 50000 units CAPS capsule Take 1 capsule by mouth once a week. On Mondays   No facility-administered encounter medications on file as of 02/01/2018.     ALLERGIES:  Allergies  Allergen Reactions  . Penicillin G      PHYSICAL EXAM:   ECOG Performance status: 1 HEENT shows no mucositis. Chest is bilaterally clear.  Cardiovascular S1-S2 regular rate and rhythm. Skin has no rashes. Extremities no edema or cyanosis. I have reviewed vital signs.   LABORATORY DATA:  I have reviewed the labs as listed.  CBC    Component Value Date/Time   WBC 1.3 (LL) 02/01/2018 0800   RBC 3.94 02/01/2018 0800   HGB 11.1 (L) 02/01/2018 0800   HCT 34.7 (L) 02/01/2018 0800   PLT 216 02/01/2018 0800   MCV 88.1 02/01/2018 0800   MCH 28.2 02/01/2018 0800   MCHC 32.0 02/01/2018 0800   RDW 14.4 02/01/2018 0800   LYMPHSABS 0.3 (L) 02/01/2018 0800   MONOABS 0.2 02/01/2018 0800   EOSABS 0.1 02/01/2018 0800   BASOSABS 0.0 02/01/2018 0800   CMP Latest Ref Rng & Units 02/01/2018 01/25/2018 01/17/2018  Glucose 65 - 99 mg/dL 105(H) 104(H) 106(H)  BUN 6 - 20 mg/dL 13 13 12   Creatinine 0.44 - 1.00 mg/dL 0.59 0.77 0.58  Sodium 135 - 145 mmol/L 136 136 137  Potassium 3.5 - 5.1 mmol/L 3.7 3.6 4.0  Chloride 101 - 111 mmol/L 101 100(L) 101  CO2 22 - 32 mmol/L 29 30 29   Calcium 8.9 - 10.3 mg/dL 9.8 9.9 9.9  Total Protein 6.5 - 8.1 g/dL 7.0 7.2 7.3  Total Bilirubin 0.3 - 1.2 mg/dL 1.2 1.2 1.0  Alkaline Phos 38 - 126 U/L 62 68 72  AST 15 - 41 U/L 18 20 22   ALT 14 - 54 U/L 23 38 34       ASSESSMENT & PLAN:   Malignant neoplasm of upper-outer quadrant of left breast in female, estrogen receptor negative ([place]) 1.  Clinical stage IIIc left breast TNBC:  - She completed 4 cycles of dose dense AC.  She is currently receiving weekly paclitaxel with carboplatin. She is here for week 6 of treatment today. - She is tolerating treatments very well.  Denies any neuropathy.  Today her white count is 1.3 and ANC is 0.7.  We will hold her treatment today.  We will contact Dr.  Ennever at [facility] clinic to see if they can administer Neupogen the day before treatment if her [place] is <1500.  She will come back next week for her week 6.  2.  Genetic testing: She  had extensive family history of malignancies.  We have done genetic testing for commonly inheritable genetic mutations, it was reported negative.      Orders placed this encounter:  No orders of the defined types were placed in this encounter.   This note includes documentation from Mike Craze, NP, who was present during this patient's office visit and evaluation.  I have reviewed this note for its completeness and accuracy.  I have edited this note accordingly based on my findings and medical opinion.      Derek Jack, MD [place] [phone]

## 2018-02-01 NOTE — Assessment & Plan Note (Signed)
1.  Clinical stage IIIc left breast TNBC:  - She completed 4 cycles of dose dense AC.  She is currently receiving weekly paclitaxel with carboplatin. She is here for week 6 of treatment today. - She is tolerating treatments very well.  Denies any neuropathy.  Today her white count is 1.3 and ANC is 0.7.  We will hold her treatment today.  We will contact Dr. Marin Olp at [facility] clinic to see if they can administer Neupogen the day before treatment if her [place] is <1500.  She will come back next week for her week 6.  2.  Genetic testing: She had extensive family history of malignancies.  We have done genetic testing for commonly inheritable genetic mutations, it was reported negative.

## 2018-02-01 NOTE — Progress Notes (Signed)
Labs reviewed with and pt seen by Dr. Delton Coombes and pt chemo tx held for 1 week due to low ANC. Pt discharged self ambulatory in satisfactory condition accompanied by family members

## 2018-02-05 ENCOUNTER — Other Ambulatory Visit (HOSPITAL_COMMUNITY): Payer: Self-pay | Admitting: Pharmacist

## 2018-02-06 ENCOUNTER — Inpatient Hospital Stay: Payer: Managed Care, Other (non HMO) | Attending: Hematology

## 2018-02-06 ENCOUNTER — Inpatient Hospital Stay: Payer: Managed Care, Other (non HMO)

## 2018-02-06 ENCOUNTER — Other Ambulatory Visit: Payer: Self-pay | Admitting: *Deleted

## 2018-02-06 ENCOUNTER — Other Ambulatory Visit (HOSPITAL_COMMUNITY): Payer: Self-pay | Admitting: Hematology

## 2018-02-06 DIAGNOSIS — Z171 Estrogen receptor negative status [ER-]: Secondary | ICD-10-CM | POA: Diagnosis not present

## 2018-02-06 DIAGNOSIS — C50412 Malignant neoplasm of upper-outer quadrant of left female breast: Secondary | ICD-10-CM | POA: Diagnosis not present

## 2018-02-06 LAB — CMP (CANCER CENTER ONLY)
ALT: 24 U/L (ref 10–47)
ANION GAP: 8 (ref 5–15)
AST: 19 U/L (ref 11–38)
Albumin: 3.7 g/dL (ref 3.5–5.0)
Alkaline Phosphatase: 68 U/L (ref 26–84)
BILIRUBIN TOTAL: 1.1 mg/dL (ref 0.2–1.6)
BUN: 15 mg/dL (ref 7–22)
CHLORIDE: 105 mmol/L (ref 98–108)
CO2: 30 mmol/L (ref 18–33)
Calcium: 9.9 mg/dL (ref 8.0–10.3)
Creatinine: 1.1 mg/dL (ref 0.60–1.20)
Glucose, Bld: 109 mg/dL (ref 73–118)
POTASSIUM: 3.9 mmol/L (ref 3.3–4.7)
Sodium: 143 mmol/L (ref 128–145)
Total Protein: 7 g/dL (ref 6.4–8.1)

## 2018-02-06 LAB — CBC WITH DIFFERENTIAL (CANCER CENTER ONLY)
BASOS PCT: 0 %
Basophils Absolute: 0 10*3/uL (ref 0.0–0.1)
Eosinophils Absolute: 0.1 10*3/uL (ref 0.0–0.5)
Eosinophils Relative: 2 %
HEMATOCRIT: 36.7 % (ref 34.8–46.6)
Hemoglobin: 12 g/dL (ref 11.6–15.9)
LYMPHS ABS: 0.7 10*3/uL — AB (ref 0.9–3.3)
Lymphocytes Relative: 23 %
MCH: 28.7 pg (ref 26.0–34.0)
MCHC: 32.7 g/dL (ref 32.0–36.0)
MCV: 87.8 fL (ref 81.0–101.0)
MONO ABS: 0.4 10*3/uL (ref 0.1–0.9)
MONOS PCT: 13 %
NEUTROS ABS: 1.9 10*3/uL (ref 1.5–6.5)
Neutrophils Relative %: 62 %
Platelet Count: 257 10*3/uL (ref 145–400)
RBC: 4.18 MIL/uL (ref 3.70–5.32)
RDW: 14.2 % (ref 11.1–15.7)
WBC Count: 3.1 10*3/uL — ABNORMAL LOW (ref 3.9–10.0)

## 2018-02-07 ENCOUNTER — Other Ambulatory Visit (HOSPITAL_COMMUNITY): Payer: Managed Care, Other (non HMO)

## 2018-02-07 ENCOUNTER — Ambulatory Visit (HOSPITAL_COMMUNITY): Payer: Managed Care, Other (non HMO)

## 2018-02-08 ENCOUNTER — Ambulatory Visit (HOSPITAL_COMMUNITY): Payer: Managed Care, Other (non HMO)

## 2018-02-08 ENCOUNTER — Other Ambulatory Visit (HOSPITAL_COMMUNITY): Payer: Managed Care, Other (non HMO)

## 2018-02-08 ENCOUNTER — Inpatient Hospital Stay (HOSPITAL_COMMUNITY): Payer: Managed Care, Other (non HMO)

## 2018-02-08 ENCOUNTER — Encounter (HOSPITAL_COMMUNITY): Payer: Self-pay

## 2018-02-08 VITALS — BP 130/66 | HR 82 | Temp 97.9°F | Resp 18 | Wt 184.6 lb

## 2018-02-08 DIAGNOSIS — T451X5S Adverse effect of antineoplastic and immunosuppressive drugs, sequela: Principal | ICD-10-CM

## 2018-02-08 DIAGNOSIS — C50412 Malignant neoplasm of upper-outer quadrant of left female breast: Secondary | ICD-10-CM | POA: Diagnosis not present

## 2018-02-08 DIAGNOSIS — D701 Agranulocytosis secondary to cancer chemotherapy: Secondary | ICD-10-CM

## 2018-02-08 DIAGNOSIS — D702 Other drug-induced agranulocytosis: Principal | ICD-10-CM

## 2018-02-08 DIAGNOSIS — Z171 Estrogen receptor negative status [ER-]: Secondary | ICD-10-CM

## 2018-02-08 MED ORDER — DIPHENHYDRAMINE HCL 50 MG/ML IJ SOLN
50.0000 mg | Freq: Once | INTRAMUSCULAR | Status: AC
  Administered 2018-02-08: 50 mg via INTRAVENOUS
  Filled 2018-02-08: qty 1

## 2018-02-08 MED ORDER — SODIUM CHLORIDE 0.9 % IV SOLN
150.0000 mg | Freq: Once | INTRAVENOUS | Status: AC
  Administered 2018-02-08: 150 mg via INTRAVENOUS
  Filled 2018-02-08: qty 15

## 2018-02-08 MED ORDER — SODIUM CHLORIDE 0.9 % IV SOLN
80.0000 mg/m2 | Freq: Once | INTRAVENOUS | Status: AC
  Administered 2018-02-08: 150 mg via INTRAVENOUS
  Filled 2018-02-08: qty 25

## 2018-02-08 MED ORDER — SODIUM CHLORIDE 0.9 % IV SOLN
Freq: Once | INTRAVENOUS | Status: AC
Start: 2018-02-08 — End: 2018-02-08
  Administered 2018-02-08: 10:00:00 via INTRAVENOUS

## 2018-02-08 MED ORDER — HEPARIN SOD (PORK) LOCK FLUSH 100 UNIT/ML IV SOLN
500.0000 [IU] | Freq: Once | INTRAVENOUS | Status: AC | PRN
  Administered 2018-02-08: 500 [IU]
  Filled 2018-02-08: qty 5

## 2018-02-08 MED ORDER — PALONOSETRON HCL INJECTION 0.25 MG/5ML
0.2500 mg | Freq: Once | INTRAVENOUS | Status: AC
  Administered 2018-02-08: 0.25 mg via INTRAVENOUS
  Filled 2018-02-08: qty 5

## 2018-02-08 MED ORDER — SODIUM CHLORIDE 0.9% FLUSH
10.0000 mL | INTRAVENOUS | Status: DC | PRN
  Administered 2018-02-08: 10 mL
  Filled 2018-02-08: qty 10

## 2018-02-08 MED ORDER — SODIUM CHLORIDE 0.9 % IV SOLN
20.0000 mg | Freq: Once | INTRAVENOUS | Status: AC
  Administered 2018-02-08: 20 mg via INTRAVENOUS
  Filled 2018-02-08: qty 2

## 2018-02-08 MED ORDER — FAMOTIDINE IN NACL 20-0.9 MG/50ML-% IV SOLN
20.0000 mg | Freq: Once | INTRAVENOUS | Status: AC
  Administered 2018-02-08: 20 mg via INTRAVENOUS
  Filled 2018-02-08: qty 50

## 2018-02-08 NOTE — Patient Instructions (Signed)
[facility] Discharge Instructions for Patients Receiving Chemotherapy   Beginning January 23rd 2017 lab work for the Cancer Center will be done in the  Main lab at [place] on 1st floor. If you have a lab appointment with the Cancer Center please come in thru the  Main Entrance and check in at the main information desk   Today you received the following chemotherapy agents Taxol and Carboplatin. Follow-up as scheduled. Call clinic for any questions or concerns  To help prevent nausea and vomiting after your treatment, we encourage you to take your nausea medication.   If you develop nausea and vomiting, or diarrhea that is not controlled by your medication, call the clinic.  The clinic phone number is [phone]. Office hours are Monday-Friday 8:30am-5:00pm.  BELOW ARE SYMPTOMS THAT SHOULD BE REPORTED IMMEDIATELY:  *FEVER GREATER THAN 101.0 F  *CHILLS WITH OR WITHOUT FEVER  NAUSEA AND VOMITING THAT IS NOT CONTROLLED WITH YOUR NAUSEA MEDICATION  *UNUSUAL SHORTNESS OF BREATH  *UNUSUAL BRUISING OR BLEEDING  TENDERNESS IN MOUTH AND THROAT WITH OR WITHOUT PRESENCE OF ULCERS  *URINARY PROBLEMS  *BOWEL PROBLEMS  UNUSUAL RASH Items with * indicate a potential emergency and should be followed up as soon as possible. If you have an emergency after office hours please contact your primary care physician or go to the nearest emergency department.  Please call the clinic during office hours if you have any questions or concerns.   You may also contact the Patient Navigator at [phone] should you have any questions or need assistance in obtaining follow up care.      Resources For Cancer Patients and their Caregivers ? American Cancer Society: Can assist with transportation, wigs, general needs, runs Look Good Feel Better.        1-888-227-6333 ? Cancer Care: Provides financial assistance, online support groups, medication/co-pay assistance.   1-800-813-HOPE (4673) ? [facility] Assists [place] cancer patients and their families through emotional , educational and financial support.  336-427-4357 ? [place] DSS Where to apply for food stamps, Medicaid and utility assistance. 336-342-1394 ? RCATS: Transportation to medical appointments. 336-347-2287 ? Social Security Administration: May apply for disability if have a Stage IV cancer. 336-342-7796 1-800-772-1213 ? [place] Aging, Disability and Transit Services: Assists with nutrition, care and transit needs. 336-349-2343         

## 2018-02-08 NOTE — Progress Notes (Signed)
Brenda Richmond tolerated chemo tx well without complaints or incident. Labs from 5/29 reviewed with Dr. Delton Coombes and pt approved for chemo tx today. Dr. Delton Coombes also ordered for pt to have labs drawn with possible Neupogen 480 mcg( if [place] less than 1500)next Wed. 6/5 at [facility]. [phone]. Spoke with Orpah Cobb RN at Dr. Antonieta Pert office regarding these orders and one time Neupogen order put in by [facility] pharmacist.Pt to return next Friday 6/7 to [facility] for next chemo txThese appts were also reviewed with the pt and family members with understanding verbalized. VSS upon discharge. Pt discharged self ambulatory in satisfactory condition accompanied by family members

## 2018-02-13 ENCOUNTER — Inpatient Hospital Stay: Payer: Managed Care, Other (non HMO)

## 2018-02-13 ENCOUNTER — Inpatient Hospital Stay: Payer: Managed Care, Other (non HMO) | Attending: Hematology & Oncology

## 2018-02-13 VITALS — BP 137/85 | HR 89 | Temp 98.6°F | Resp 17

## 2018-02-13 DIAGNOSIS — C773 Secondary and unspecified malignant neoplasm of axilla and upper limb lymph nodes: Secondary | ICD-10-CM | POA: Diagnosis not present

## 2018-02-13 DIAGNOSIS — Z171 Estrogen receptor negative status [ER-]: Secondary | ICD-10-CM | POA: Insufficient documentation

## 2018-02-13 DIAGNOSIS — Z7689 Persons encountering health services in other specified circumstances: Secondary | ICD-10-CM | POA: Insufficient documentation

## 2018-02-13 DIAGNOSIS — T451X5S Adverse effect of antineoplastic and immunosuppressive drugs, sequela: Principal | ICD-10-CM

## 2018-02-13 DIAGNOSIS — C50412 Malignant neoplasm of upper-outer quadrant of left female breast: Secondary | ICD-10-CM | POA: Diagnosis present

## 2018-02-13 DIAGNOSIS — D702 Other drug-induced agranulocytosis: Secondary | ICD-10-CM

## 2018-02-13 DIAGNOSIS — D701 Agranulocytosis secondary to cancer chemotherapy: Secondary | ICD-10-CM

## 2018-02-13 LAB — CBC WITH DIFFERENTIAL/PLATELET
Basophils Absolute: 0 10*3/uL (ref 0.0–0.1)
Basophils Relative: 1 %
EOS PCT: 3 %
Eosinophils Absolute: 0.1 10*3/uL (ref 0.0–0.5)
HCT: 36.9 % (ref 34.8–46.6)
Hemoglobin: 12 g/dL (ref 11.6–15.9)
LYMPHS PCT: 33 %
Lymphs Abs: 0.6 10*3/uL — ABNORMAL LOW (ref 0.9–3.3)
MCH: 28.4 pg (ref 26.0–34.0)
MCHC: 32.5 g/dL (ref 32.0–36.0)
MCV: 87.4 fL (ref 81.0–101.0)
Monocytes Absolute: 0.2 10*3/uL (ref 0.1–0.9)
Monocytes Relative: 8 %
Neutro Abs: 1 10*3/uL — ABNORMAL LOW (ref 1.5–6.5)
Neutrophils Relative %: 55 %
PLATELETS: 239 10*3/uL (ref 145–400)
RBC: 4.22 MIL/uL (ref 3.70–5.32)
RDW: 13.4 % (ref 11.1–15.7)
WBC: 1.8 10*3/uL — AB (ref 3.9–10.0)

## 2018-02-13 LAB — COMPREHENSIVE METABOLIC PANEL
ALK PHOS: 73 U/L (ref 26–84)
ALT: 26 U/L (ref 10–47)
AST: 21 U/L (ref 11–38)
Albumin: 3.7 g/dL (ref 3.5–5.0)
Anion gap: 11 (ref 5–15)
BUN: 14 mg/dL (ref 7–22)
CO2: 31 mmol/L (ref 18–33)
Calcium: 9.9 mg/dL (ref 8.0–10.3)
Chloride: 100 mmol/L (ref 98–108)
Creatinine, Ser: 0.8 mg/dL (ref 0.60–1.20)
GLUCOSE: 100 mg/dL (ref 73–118)
POTASSIUM: 3.7 mmol/L (ref 3.3–4.7)
Sodium: 142 mmol/L (ref 128–145)
TOTAL PROTEIN: 7.1 g/dL (ref 6.4–8.1)
Total Bilirubin: 1.4 mg/dL (ref 0.2–1.6)

## 2018-02-13 MED ORDER — TBO-FILGRASTIM 480 MCG/0.8ML ~~LOC~~ SOSY
480.0000 ug | PREFILLED_SYRINGE | Freq: Once | SUBCUTANEOUS | Status: AC
  Administered 2018-02-13: 480 ug via SUBCUTANEOUS
  Filled 2018-02-13: qty 0.8

## 2018-02-13 NOTE — Patient Instructions (Signed)
Tbo-Filgrastim injection What is this medicine? TBO-FILGRASTIM (T B O fil GRA stim) is a granulocyte colony-stimulating factor that stimulates the growth of neutrophils, a type of white blood cell important in the body's fight against infection. It is used to reduce the incidence of fever and infection in patients with certain types of cancer who are receiving chemotherapy that affects the bone marrow. This medicine may be used for other purposes; ask your health care provider or pharmacist if you have questions. COMMON BRAND NAME(S): Granix What should I tell my health care provider before I take this medicine? They need to know if you have any of these conditions: -bone scan or tests planned -kidney disease -sickle cell anemia -an unusual or allergic reaction to tbo-filgrastim, filgrastim, pegfilgrastim, other medicines, foods, dyes, or preservatives -pregnant or trying to get pregnant -breast-feeding How should I use this medicine? This medicine is for injection under the skin. If you get this medicine at home, you will be taught how to prepare and give this medicine. Refer to the Instructions for Use that come with your medication packaging. Use exactly as directed. Take your medicine at regular intervals. Do not take your medicine more often than directed. It is important that you put your used needles and syringes in a special sharps container. Do not put them in a trash can. If you do not have a sharps container, call your pharmacist or healthcare provider to get one. Talk to your pediatrician regarding the use of this medicine in children. Special care may be needed. Overdosage: If you think you have taken too much of this medicine contact a poison control center or emergency room at once. NOTE: This medicine is only for you. Do not share this medicine with others. What if I miss a dose? It is important not to miss your dose. Call your doctor or health care professional if you miss a  dose. What may interact with this medicine? This medicine may interact with the following medications: -medicines that may cause a release of neutrophils, such as lithium This list may not describe all possible interactions. Give your health care provider a list of all the medicines, herbs, non-prescription drugs, or dietary supplements you use. Also tell them if you smoke, drink alcohol, or use illegal drugs. Some items may interact with your medicine. What should I watch for while using this medicine? You may need blood work done while you are taking this medicine. What side effects may I notice from receiving this medicine? Side effects that you should report to your doctor or health care professional as soon as possible: -allergic reactions like skin rash, itching or hives, swelling of the face, lips, or tongue -blood in the urine -dark urine -dizziness -fast heartbeat -feeling faint -shortness of breath or breathing problems -signs and symptoms of infection like fever or chills; cough; or sore throat -signs and symptoms of kidney injury like trouble passing urine or change in the amount of urine -stomach or side pain, or pain at the shoulder -sweating -swelling of the legs, ankles, or abdomen -tiredness Side effects that usually do not require medical attention (report to your doctor or health care professional if they continue or are bothersome): -bone pain -headache -muscle pain -vomiting This list may not describe all possible side effects. Call your doctor for medical advice about side effects. You may report side effects to FDA at 1-800-FDA-1088. Where should I keep my medicine? Keep out of the reach of children. Store in a refrigerator between   2 and 8 degrees C (36 and 46 degrees F). Keep in carton to protect from light. Throw away this medicine if it is left out of the refrigerator for more than 5 consecutive days. Throw away any unused medicine after the expiration  date. NOTE: This sheet is a summary. It may not cover all possible information. If you have questions about this medicine, talk to your doctor, pharmacist, or health care provider.  2018 Elsevier/Gold Standard (2015-10-18 19:07:04)  

## 2018-02-14 ENCOUNTER — Ambulatory Visit (HOSPITAL_COMMUNITY): Payer: Managed Care, Other (non HMO)

## 2018-02-14 ENCOUNTER — Other Ambulatory Visit (HOSPITAL_COMMUNITY): Payer: Managed Care, Other (non HMO)

## 2018-02-14 ENCOUNTER — Ambulatory Visit (HOSPITAL_COMMUNITY): Payer: Managed Care, Other (non HMO) | Admitting: Hematology

## 2018-02-15 ENCOUNTER — Inpatient Hospital Stay (HOSPITAL_COMMUNITY): Payer: Managed Care, Other (non HMO)

## 2018-02-15 ENCOUNTER — Inpatient Hospital Stay (HOSPITAL_COMMUNITY): Payer: Managed Care, Other (non HMO) | Attending: Hematology

## 2018-02-15 ENCOUNTER — Encounter (HOSPITAL_COMMUNITY): Payer: Self-pay

## 2018-02-15 VITALS — BP 111/65 | HR 87 | Temp 97.7°F | Resp 18 | Wt 184.8 lb

## 2018-02-15 DIAGNOSIS — Z5111 Encounter for antineoplastic chemotherapy: Secondary | ICD-10-CM | POA: Insufficient documentation

## 2018-02-15 DIAGNOSIS — K5900 Constipation, unspecified: Secondary | ICD-10-CM | POA: Insufficient documentation

## 2018-02-15 DIAGNOSIS — Z171 Estrogen receptor negative status [ER-]: Principal | ICD-10-CM

## 2018-02-15 DIAGNOSIS — C773 Secondary and unspecified malignant neoplasm of axilla and upper limb lymph nodes: Secondary | ICD-10-CM | POA: Insufficient documentation

## 2018-02-15 DIAGNOSIS — Z79899 Other long term (current) drug therapy: Secondary | ICD-10-CM | POA: Diagnosis not present

## 2018-02-15 DIAGNOSIS — R58 Hemorrhage, not elsewhere classified: Secondary | ICD-10-CM | POA: Diagnosis not present

## 2018-02-15 DIAGNOSIS — C50412 Malignant neoplasm of upper-outer quadrant of left female breast: Secondary | ICD-10-CM

## 2018-02-15 DIAGNOSIS — I10 Essential (primary) hypertension: Secondary | ICD-10-CM | POA: Diagnosis not present

## 2018-02-15 DIAGNOSIS — Z8042 Family history of malignant neoplasm of prostate: Secondary | ICD-10-CM | POA: Insufficient documentation

## 2018-02-15 DIAGNOSIS — Z880 Allergy status to penicillin: Secondary | ICD-10-CM | POA: Diagnosis not present

## 2018-02-15 DIAGNOSIS — Z7901 Long term (current) use of anticoagulants: Secondary | ICD-10-CM | POA: Insufficient documentation

## 2018-02-15 DIAGNOSIS — Z803 Family history of malignant neoplasm of breast: Secondary | ICD-10-CM | POA: Insufficient documentation

## 2018-02-15 DIAGNOSIS — L299 Pruritus, unspecified: Secondary | ICD-10-CM | POA: Diagnosis not present

## 2018-02-15 DIAGNOSIS — Z86718 Personal history of other venous thrombosis and embolism: Secondary | ICD-10-CM | POA: Insufficient documentation

## 2018-02-15 DIAGNOSIS — R040 Epistaxis: Secondary | ICD-10-CM | POA: Insufficient documentation

## 2018-02-15 DIAGNOSIS — R682 Dry mouth, unspecified: Secondary | ICD-10-CM | POA: Insufficient documentation

## 2018-02-15 LAB — CBC WITH DIFFERENTIAL/PLATELET
BASOS ABS: 0 10*3/uL (ref 0.0–0.1)
BASOS PCT: 0 %
EOS PCT: 1 %
Eosinophils Absolute: 0.1 10*3/uL (ref 0.0–0.7)
HEMATOCRIT: 35.4 % — AB (ref 36.0–46.0)
Hemoglobin: 11.4 g/dL — ABNORMAL LOW (ref 12.0–15.0)
Lymphocytes Relative: 4 %
Lymphs Abs: 0.7 10*3/uL (ref 0.7–4.0)
MCH: 28.1 pg (ref 26.0–34.0)
MCHC: 32.2 g/dL (ref 30.0–36.0)
MCV: 87.2 fL (ref 78.0–100.0)
MONO ABS: 0.8 10*3/uL (ref 0.1–1.0)
Monocytes Relative: 5 %
NEUTROS ABS: 15.5 10*3/uL — AB (ref 1.7–7.7)
Neutrophils Relative %: 90 %
PLATELETS: 224 10*3/uL (ref 150–400)
RBC: 4.06 MIL/uL (ref 3.87–5.11)
RDW: 13.9 % (ref 11.5–15.5)
WBC: 17.2 10*3/uL — ABNORMAL HIGH (ref 4.0–10.5)

## 2018-02-15 LAB — COMPREHENSIVE METABOLIC PANEL
ALBUMIN: 4 g/dL (ref 3.5–5.0)
ALK PHOS: 83 U/L (ref 38–126)
ALT: 21 U/L (ref 14–54)
ANION GAP: 8 (ref 5–15)
AST: 17 U/L (ref 15–41)
BILIRUBIN TOTAL: 0.9 mg/dL (ref 0.3–1.2)
BUN: 12 mg/dL (ref 6–20)
CALCIUM: 10 mg/dL (ref 8.9–10.3)
CO2: 30 mmol/L (ref 22–32)
Chloride: 101 mmol/L (ref 101–111)
Creatinine, Ser: 0.67 mg/dL (ref 0.44–1.00)
GFR calc Af Amer: >60 mL/min (ref >60)
GLUCOSE: 114 mg/dL — AB (ref 65–99)
Potassium: 3.4 mmol/L — ABNORMAL LOW (ref 3.5–5.1)
Sodium: 139 mmol/L (ref 135–145)
TOTAL PROTEIN: 7.1 g/dL (ref 6.5–8.1)

## 2018-02-15 MED ORDER — SODIUM CHLORIDE 0.9 % IV SOLN
Freq: Once | INTRAVENOUS | Status: AC
  Administered 2018-02-15: 11:00:00 via INTRAVENOUS

## 2018-02-15 MED ORDER — SODIUM CHLORIDE 0.9 % IV SOLN
80.0000 mg/m2 | Freq: Once | INTRAVENOUS | Status: AC
  Administered 2018-02-15: 150 mg via INTRAVENOUS
  Filled 2018-02-15: qty 25

## 2018-02-15 MED ORDER — SODIUM CHLORIDE 0.9% FLUSH
10.0000 mL | INTRAVENOUS | Status: DC | PRN
  Administered 2018-02-15: 10 mL
  Filled 2018-02-15: qty 10

## 2018-02-15 MED ORDER — FAMOTIDINE IN NACL 20-0.9 MG/50ML-% IV SOLN
20.0000 mg | Freq: Once | INTRAVENOUS | Status: AC
  Administered 2018-02-15: 20 mg via INTRAVENOUS
  Filled 2018-02-15: qty 50

## 2018-02-15 MED ORDER — DIPHENHYDRAMINE HCL 50 MG/ML IJ SOLN
50.0000 mg | Freq: Once | INTRAMUSCULAR | Status: AC
  Administered 2018-02-15: 50 mg via INTRAVENOUS
  Filled 2018-02-15: qty 1

## 2018-02-15 MED ORDER — HEPARIN SOD (PORK) LOCK FLUSH 100 UNIT/ML IV SOLN
500.0000 [IU] | Freq: Once | INTRAVENOUS | Status: AC | PRN
  Administered 2018-02-15: 500 [IU]
  Filled 2018-02-15: qty 5

## 2018-02-15 MED ORDER — SODIUM CHLORIDE 0.9 % IV SOLN
20.0000 mg | Freq: Once | INTRAVENOUS | Status: AC
  Administered 2018-02-15: 20 mg via INTRAVENOUS
  Filled 2018-02-15: qty 2

## 2018-02-15 MED ORDER — PALONOSETRON HCL INJECTION 0.25 MG/5ML
0.2500 mg | Freq: Once | INTRAVENOUS | Status: AC
  Administered 2018-02-15: 0.25 mg via INTRAVENOUS
  Filled 2018-02-15: qty 5

## 2018-02-15 MED ORDER — SODIUM CHLORIDE 0.9 % IV SOLN
150.0000 mg | Freq: Once | INTRAVENOUS | Status: AC
  Administered 2018-02-15: 150 mg via INTRAVENOUS
  Filled 2018-02-15: qty 15

## 2018-02-15 NOTE — Patient Instructions (Signed)
[facility] Discharge Instructions for Patients Receiving Chemotherapy   Beginning January 23rd 2017 lab work for the Cancer Center will be done in the  Main lab at [place] on 1st floor. If you have a lab appointment with the Cancer Center please come in thru the  Main Entrance and check in at the main information desk   Today you received the following chemotherapy agents Taxol and Carboplatin. Follow-up as scheduled. Call clinic for any questions or concerns  To help prevent nausea and vomiting after your treatment, we encourage you to take your nausea medication.   If you develop nausea and vomiting, or diarrhea that is not controlled by your medication, call the clinic.  The clinic phone number is [phone]. Office hours are Monday-Friday 8:30am-5:00pm.  BELOW ARE SYMPTOMS THAT SHOULD BE REPORTED IMMEDIATELY:  *FEVER GREATER THAN 101.0 F  *CHILLS WITH OR WITHOUT FEVER  NAUSEA AND VOMITING THAT IS NOT CONTROLLED WITH YOUR NAUSEA MEDICATION  *UNUSUAL SHORTNESS OF BREATH  *UNUSUAL BRUISING OR BLEEDING  TENDERNESS IN MOUTH AND THROAT WITH OR WITHOUT PRESENCE OF ULCERS  *URINARY PROBLEMS  *BOWEL PROBLEMS  UNUSUAL RASH Items with * indicate a potential emergency and should be followed up as soon as possible. If you have an emergency after office hours please contact your primary care physician or go to the nearest emergency department.  Please call the clinic during office hours if you have any questions or concerns.   You may also contact the Patient Navigator at [phone] should you have any questions or need assistance in obtaining follow up care.      Resources For Cancer Patients and their Caregivers ? American Cancer Society: Can assist with transportation, wigs, general needs, runs Look Good Feel Better.        1-888-227-6333 ? Cancer Care: Provides financial assistance, online support groups, medication/co-pay assistance.   1-800-813-HOPE (4673) ? [facility] Assists [place] cancer patients and their families through emotional , educational and financial support.  336-427-4357 ? [place] DSS Where to apply for food stamps, Medicaid and utility assistance. 336-342-1394 ? RCATS: Transportation to medical appointments. 336-347-2287 ? Social Security Administration: May apply for disability if have a Stage IV cancer. 336-342-7796 1-800-772-1213 ? [place] Aging, Disability and Transit Services: Assists with nutrition, care and transit needs. 336-349-2343         

## 2018-02-15 NOTE — Progress Notes (Signed)
62 Labs reviewed with Dr. Delton Coombes and pt approved for chemo tx today per MD                             [facility] tolerated chemo tx well without complaints or incident. VSS upon discharge. Pt to have labs drawn with possible Neupogen injection at [facility] on 6/12 and return to [facility] on 6/14. Pt verbalized understanding of these appts. Pt discharged self ambulatory in satisfactory condition accompanied by family member

## 2018-02-20 ENCOUNTER — Other Ambulatory Visit: Payer: Self-pay

## 2018-02-20 ENCOUNTER — Other Ambulatory Visit (HOSPITAL_COMMUNITY): Payer: Self-pay | Admitting: *Deleted

## 2018-02-20 ENCOUNTER — Inpatient Hospital Stay: Payer: Managed Care, Other (non HMO)

## 2018-02-20 VITALS — BP 131/74 | HR 75 | Temp 97.0°F | Resp 18

## 2018-02-20 DIAGNOSIS — C50412 Malignant neoplasm of upper-outer quadrant of left female breast: Secondary | ICD-10-CM | POA: Diagnosis not present

## 2018-02-20 DIAGNOSIS — C50411 Malignant neoplasm of upper-outer quadrant of right female breast: Secondary | ICD-10-CM

## 2018-02-20 DIAGNOSIS — Z171 Estrogen receptor negative status [ER-]: Principal | ICD-10-CM

## 2018-02-20 LAB — COMPREHENSIVE METABOLIC PANEL
ALBUMIN: 3.6 g/dL (ref 3.5–5.0)
ALT: 35 U/L (ref 10–47)
ANION GAP: 8 (ref 5–15)
AST: 25 U/L (ref 11–38)
Alkaline Phosphatase: 81 U/L (ref 26–84)
BILIRUBIN TOTAL: 1.2 mg/dL (ref 0.2–1.6)
BUN: 14 mg/dL (ref 7–22)
CALCIUM: 9.7 mg/dL (ref 8.0–10.3)
CHLORIDE: 103 mmol/L (ref 98–108)
CO2: 30 mmol/L (ref 18–33)
CREATININE: 0.8 mg/dL (ref 0.60–1.20)
Glucose, Bld: 99 mg/dL (ref 73–118)
Potassium: 3.8 mmol/L (ref 3.3–4.7)
Sodium: 141 mmol/L (ref 128–145)
Total Protein: 7 g/dL (ref 6.4–8.1)

## 2018-02-20 LAB — CBC WITH DIFFERENTIAL/PLATELET
Basophils Absolute: 0 10*3/uL (ref 0.0–0.1)
Basophils Relative: 1 %
Eosinophils Absolute: 0 10*3/uL (ref 0.0–0.5)
Eosinophils Relative: 3 %
HEMATOCRIT: 35.5 % (ref 34.8–46.6)
HEMOGLOBIN: 11.4 g/dL — AB (ref 11.6–15.9)
LYMPHS ABS: 0.4 10*3/uL — AB (ref 0.9–3.3)
LYMPHS PCT: 38 %
MCH: 28.4 pg (ref 26.0–34.0)
MCHC: 32.1 g/dL (ref 32.0–36.0)
MCV: 88.3 fL (ref 81.0–101.0)
MONO ABS: 0.1 10*3/uL (ref 0.1–0.9)
MONOS PCT: 10 %
NEUTROS ABS: 0.6 10*3/uL — AB (ref 1.5–6.5)
NEUTROS PCT: 48 %
Platelets: 231 10*3/uL (ref 145–400)
RBC: 4.02 MIL/uL (ref 3.70–5.32)
RDW: 13.4 % (ref 11.1–15.7)
WBC: 1.2 10*3/uL — ABNORMAL LOW (ref 3.9–10.0)

## 2018-02-20 MED ORDER — TBO-FILGRASTIM 480 MCG/0.8ML ~~LOC~~ SOSY
480.0000 ug | PREFILLED_SYRINGE | Freq: Once | SUBCUTANEOUS | Status: AC
  Administered 2018-02-20: 480 ug via SUBCUTANEOUS
  Filled 2018-02-20: qty 0.8

## 2018-02-20 MED ORDER — FILGRASTIM 480 MCG/0.8ML IJ SOSY
480.0000 ug | PREFILLED_SYRINGE | Freq: Once | INTRAMUSCULAR | Status: DC
  Filled 2018-02-20: qty 0.8

## 2018-02-20 NOTE — Patient Instructions (Signed)
Tbo-Filgrastim injection What is this medicine? TBO-FILGRASTIM (T B O fil GRA stim) is a granulocyte colony-stimulating factor that stimulates the growth of neutrophils, a type of white blood cell important in the body's fight against infection. It is used to reduce the incidence of fever and infection in patients with certain types of cancer who are receiving chemotherapy that affects the bone marrow. This medicine may be used for other purposes; ask your health care provider or pharmacist if you have questions. COMMON BRAND NAME(S): Granix What should I tell my health care provider before I take this medicine? They need to know if you have any of these conditions: -bone scan or tests planned -kidney disease -sickle cell anemia -an unusual or allergic reaction to tbo-filgrastim, filgrastim, pegfilgrastim, other medicines, foods, dyes, or preservatives -pregnant or trying to get pregnant -breast-feeding How should I use this medicine? This medicine is for injection under the skin. If you get this medicine at home, you will be taught how to prepare and give this medicine. Refer to the Instructions for Use that come with your medication packaging. Use exactly as directed. Take your medicine at regular intervals. Do not take your medicine more often than directed. It is important that you put your used needles and syringes in a special sharps container. Do not put them in a trash can. If you do not have a sharps container, call your pharmacist or healthcare provider to get one. Talk to your pediatrician regarding the use of this medicine in children. Special care may be needed. Overdosage: If you think you have taken too much of this medicine contact a poison control center or emergency room at once. NOTE: This medicine is only for you. Do not share this medicine with others. What if I miss a dose? It is important not to miss your dose. Call your doctor or health care professional if you miss a  dose. What may interact with this medicine? This medicine may interact with the following medications: -medicines that may cause a release of neutrophils, such as lithium This list may not describe all possible interactions. Give your health care provider a list of all the medicines, herbs, non-prescription drugs, or dietary supplements you use. Also tell them if you smoke, drink alcohol, or use illegal drugs. Some items may interact with your medicine. What should I watch for while using this medicine? You may need blood work done while you are taking this medicine. What side effects may I notice from receiving this medicine? Side effects that you should report to your doctor or health care professional as soon as possible: -allergic reactions like skin rash, itching or hives, swelling of the face, lips, or tongue -blood in the urine -dark urine -dizziness -fast heartbeat -feeling faint -shortness of breath or breathing problems -signs and symptoms of infection like fever or chills; cough; or sore throat -signs and symptoms of kidney injury like trouble passing urine or change in the amount of urine -stomach or side pain, or pain at the shoulder -sweating -swelling of the legs, ankles, or abdomen -tiredness Side effects that usually do not require medical attention (report to your doctor or health care professional if they continue or are bothersome): -bone pain -headache -muscle pain -vomiting This list may not describe all possible side effects. Call your doctor for medical advice about side effects. You may report side effects to FDA at 1-800-FDA-1088. Where should I keep my medicine? Keep out of the reach of children. Store in a refrigerator between   2 and 8 degrees C (36 and 46 degrees F). Keep in carton to protect from light. Throw away this medicine if it is left out of the refrigerator for more than 5 consecutive days. Throw away any unused medicine after the expiration  date. NOTE: This sheet is a summary. It may not cover all possible information. If you have questions about this medicine, talk to your doctor, pharmacist, or health care provider.  2018 Elsevier/Gold Standard (2015-10-18 19:07:04)  

## 2018-02-22 ENCOUNTER — Inpatient Hospital Stay (HOSPITAL_COMMUNITY): Payer: Managed Care, Other (non HMO)

## 2018-02-22 ENCOUNTER — Encounter (HOSPITAL_COMMUNITY): Payer: Self-pay | Admitting: Hematology

## 2018-02-22 ENCOUNTER — Inpatient Hospital Stay (HOSPITAL_BASED_OUTPATIENT_CLINIC_OR_DEPARTMENT_OTHER): Payer: Managed Care, Other (non HMO) | Admitting: Hematology

## 2018-02-22 VITALS — BP 116/70 | HR 89 | Temp 97.5°F | Resp 18

## 2018-02-22 DIAGNOSIS — K5900 Constipation, unspecified: Secondary | ICD-10-CM

## 2018-02-22 DIAGNOSIS — Z79899 Other long term (current) drug therapy: Secondary | ICD-10-CM | POA: Diagnosis not present

## 2018-02-22 DIAGNOSIS — Z171 Estrogen receptor negative status [ER-]: Secondary | ICD-10-CM | POA: Diagnosis not present

## 2018-02-22 DIAGNOSIS — R682 Dry mouth, unspecified: Secondary | ICD-10-CM | POA: Diagnosis not present

## 2018-02-22 DIAGNOSIS — Z86718 Personal history of other venous thrombosis and embolism: Secondary | ICD-10-CM | POA: Diagnosis not present

## 2018-02-22 DIAGNOSIS — I10 Essential (primary) hypertension: Secondary | ICD-10-CM | POA: Diagnosis not present

## 2018-02-22 DIAGNOSIS — C50412 Malignant neoplasm of upper-outer quadrant of left female breast: Secondary | ICD-10-CM

## 2018-02-22 DIAGNOSIS — Z8042 Family history of malignant neoplasm of prostate: Secondary | ICD-10-CM

## 2018-02-22 DIAGNOSIS — C773 Secondary and unspecified malignant neoplasm of axilla and upper limb lymph nodes: Secondary | ICD-10-CM | POA: Diagnosis not present

## 2018-02-22 DIAGNOSIS — Z7901 Long term (current) use of anticoagulants: Secondary | ICD-10-CM

## 2018-02-22 DIAGNOSIS — Z803 Family history of malignant neoplasm of breast: Secondary | ICD-10-CM

## 2018-02-22 LAB — CBC WITH DIFFERENTIAL/PLATELET
BASOS ABS: 0 10*3/uL (ref 0.0–0.1)
BASOS PCT: 0 %
EOS ABS: 0.1 10*3/uL (ref 0.0–0.7)
EOS PCT: 1 %
HCT: 36.8 % (ref 36.0–46.0)
Hemoglobin: 11.6 g/dL — ABNORMAL LOW (ref 12.0–15.0)
Lymphocytes Relative: 7 %
Lymphs Abs: 0.7 10*3/uL (ref 0.7–4.0)
MCH: 28 pg (ref 26.0–34.0)
MCHC: 31.5 g/dL (ref 30.0–36.0)
MCV: 88.9 fL (ref 78.0–100.0)
MONO ABS: 0.5 10*3/uL (ref 0.1–1.0)
MONOS PCT: 5 %
Neutro Abs: 8 10*3/uL — ABNORMAL HIGH (ref 1.7–7.7)
Neutrophils Relative %: 87 %
PLATELETS: 208 10*3/uL (ref 150–400)
RBC: 4.14 MIL/uL (ref 3.87–5.11)
RDW: 13.9 % (ref 11.5–15.5)
WBC: 9.2 10*3/uL (ref 4.0–10.5)

## 2018-02-22 LAB — COMPREHENSIVE METABOLIC PANEL
ALBUMIN: 4 g/dL (ref 3.5–5.0)
ALT: 30 U/L (ref 14–54)
ANION GAP: 7 (ref 5–15)
AST: 20 U/L (ref 15–41)
Alkaline Phosphatase: 81 U/L (ref 38–126)
BILIRUBIN TOTAL: 0.7 mg/dL (ref 0.3–1.2)
BUN: 13 mg/dL (ref 6–20)
CO2: 28 mmol/L (ref 22–32)
Calcium: 10 mg/dL (ref 8.9–10.3)
Chloride: 102 mmol/L (ref 101–111)
Creatinine, Ser: 0.63 mg/dL (ref 0.44–1.00)
GFR calc Af Amer: >60 mL/min (ref >60)
GFR calc non Af Amer: >60 mL/min (ref >60)
GLUCOSE: 105 mg/dL — AB (ref 65–99)
Potassium: 3.7 mmol/L (ref 3.5–5.1)
SODIUM: 137 mmol/L (ref 135–145)
TOTAL PROTEIN: 7 g/dL (ref 6.5–8.1)

## 2018-02-22 MED ORDER — SODIUM CHLORIDE 0.9 % IV SOLN
80.0000 mg/m2 | Freq: Once | INTRAVENOUS | Status: AC
  Administered 2018-02-22: 150 mg via INTRAVENOUS
  Filled 2018-02-22: qty 25

## 2018-02-22 MED ORDER — DIPHENHYDRAMINE HCL 50 MG/ML IJ SOLN
INTRAMUSCULAR | Status: AC
  Filled 2018-02-22: qty 1

## 2018-02-22 MED ORDER — FAMOTIDINE IN NACL 20-0.9 MG/50ML-% IV SOLN
INTRAVENOUS | Status: AC
  Filled 2018-02-22: qty 50

## 2018-02-22 MED ORDER — SODIUM CHLORIDE 0.9 % IV SOLN
20.0000 mg | Freq: Once | INTRAVENOUS | Status: AC
  Administered 2018-02-22: 20 mg via INTRAVENOUS
  Filled 2018-02-22: qty 2

## 2018-02-22 MED ORDER — SODIUM CHLORIDE 0.9 % IV SOLN
Freq: Once | INTRAVENOUS | Status: AC
  Administered 2018-02-22: 10:00:00 via INTRAVENOUS

## 2018-02-22 MED ORDER — SODIUM CHLORIDE 0.9 % IV SOLN
203.8000 mg | Freq: Once | INTRAVENOUS | Status: AC
  Administered 2018-02-22: 200 mg via INTRAVENOUS
  Filled 2018-02-22: qty 20

## 2018-02-22 MED ORDER — HEPARIN SOD (PORK) LOCK FLUSH 100 UNIT/ML IV SOLN
500.0000 [IU] | Freq: Once | INTRAVENOUS | Status: AC | PRN
  Administered 2018-02-22: 500 [IU]
  Filled 2018-02-22: qty 5

## 2018-02-22 MED ORDER — DIPHENHYDRAMINE HCL 50 MG/ML IJ SOLN
50.0000 mg | Freq: Once | INTRAMUSCULAR | Status: AC
  Administered 2018-02-22: 50 mg via INTRAVENOUS

## 2018-02-22 MED ORDER — PALONOSETRON HCL INJECTION 0.25 MG/5ML
0.2500 mg | Freq: Once | INTRAVENOUS | Status: AC
  Administered 2018-02-22: 0.25 mg via INTRAVENOUS

## 2018-02-22 MED ORDER — FAMOTIDINE IN NACL 20-0.9 MG/50ML-% IV SOLN
20.0000 mg | Freq: Once | INTRAVENOUS | Status: AC
  Administered 2018-02-22: 20 mg via INTRAVENOUS

## 2018-02-22 MED ORDER — PALONOSETRON HCL INJECTION 0.25 MG/5ML
INTRAVENOUS | Status: AC
  Filled 2018-02-22: qty 5

## 2018-02-22 NOTE — Patient Instructions (Signed)
[facility]  Discharge Instructions:  You were seen by Dr. K today _______________________________________________________________  Thank you for choosing [facility] at [facility] to provide your oncology and hematology care.  To afford each patient quality time with our providers, please arrive at least 15 minutes before your scheduled appointment.  You need to re-schedule your appointment if you arrive 10 or more minutes late.  We strive to give you quality time with our providers, and arriving late affects you and other patients whose appointments are after yours.  Also, if you no show three or more times for appointments you may be dismissed from the clinic.  Again, thank you for choosing [facility] at [facility]. Our hope is that these requests will allow you access to exceptional care and in a timely manner. _______________________________________________________________  If you have questions after your visit, please contact our office at [phone] between the hours of 8:30 a.m. and 5:00 p.m. Voicemails left after 4:30 p.m. will not be returned until the following business day. _______________________________________________________________  For prescription refill requests, have your pharmacy contact our office. _______________________________________________________________  Recommendations made by the consultant and any test results will be sent to your referring physician. _______________________________________________________________ 

## 2018-02-22 NOTE — Progress Notes (Signed)
[place], [place] [address]   CLINIC:  Medical Oncology/Hematology  PCP:  Eber Hong, [address] [place] [address] [phone]   REASON FOR VISIT:  Follow-up for chemotherapy for breast cancer.  CURRENT THERAPY: Weekly Taxol and carboplatin.  BRIEF ONCOLOGIC HISTORY:    Malignant neoplasm of upper-outer quadrant of left breast in female, estrogen receptor negative ([place])   09/26/2017 Initial Biopsy    Biopsy of the left breast and left axillary lymph node consistent with high-grade TNBC, Ki-67 of 80%  Clinical stage T1cN2 M0      09/26/2017 Family History    Sister had breast cancer at age 7, HER-2 positive, multiple paternal uncles had prostate cancer, father had prostate cancer, 2 paternal cousins had breast cancer       10/11/2017 Echocardiogram    Echocardiogram on 10/11/2017 showed ejection fraction of 65-70%      10/18/2017 Imaging    CT scan of the chest, abdomen and pelvis shows tiny circumscribed noncalcified nodule in the posterior segment of the left upper lobe of the lung with no other evidence of metastatic disease  MRI of the abdomen with and without contrast ordered for elevated total bilirubin on 10/29/2017 shows no evidence of liver metastasis      10/22/2017 Genetic Testing    [employer] myRisk test for BRCA 1 and 2 and other mutations negative      10/23/2017 -  Chemotherapy    The patient had DOXOrubicin (ADRIAMYCIN) chemo injection 114 mg, 60 mg/m2, Intravenous,  Once, 4 of 4 cycles Administration: 114 mg (12/05/2017) palonosetron (ALOXI) injection 0.25 mg, 0.25 mg, Intravenous,  Once, 7 of 8 cycles Administration: 0.25 mg (12/20/2017), 0.25 mg (12/05/2017), 0.25 mg (01/17/2018), 0.25 mg (12/27/2017), 0.25 mg (01/10/2018), 0.25 mg (01/25/2018), 0.25 mg (02/15/2018), 0.25 mg (02/08/2018) pegfilgrastim (NEULASTA ONPRO KIT) injection 6 mg, 6 mg, Subcutaneous, Once, 1 of 1 cycle Administration: 6 mg  (12/05/2017) filgrastim (NEUPOGEN) injection 480 mcg, 480 mcg, Subcutaneous, Once PRN, 1 of 1 cycle pegfilgrastim-cbqv (UDENYCA) injection 6 mg, 6 mg, Subcutaneous, Once, 3 of 3 cycles CARBOplatin (PARAPLATIN) 230 mg in sodium chloride 0.9 % 250 mL chemo infusion, 230 mg (100 % of original dose 230.8 mg), Intravenous,  Once, 3 of 4 cycles Dose modification:   (original dose 230.8 mg, Cycle 5), 100 mg (original dose 100 mg, Cycle 6),   (original dose 230.8 mg, Cycle 5), 184.64 mg (80 % of original dose 230.8 mg, Cycle 5, Reason: Provider Judgment), 150 mg (original dose 100 mg, Cycle 7, Reason: Provider Judgment),   (original dose 100 mg, Cycle 7, Reason: Provider Judgment), 150 mg (original dose 100 mg, Cycle 6, Reason: Provider Judgment) Administration: 230 mg (12/20/2017), 100 mg (01/17/2018), 180 mg (12/27/2017), 150 mg (02/15/2018), 150 mg (02/08/2018) cyclophosphamide (CYTOXAN) 1,140 mg in sodium chloride 0.9 % 250 mL chemo infusion, 600 mg/m2, Intravenous,  Once, 4 of 4 cycles Administration: 1,140 mg (12/05/2017) PACLitaxel (TAXOL) 150 mg in dextrose 5 % 250 mL chemo infusion (</= 38m/m2), 80 mg/m2 = 150 mg, Intravenous,  Once, 3 of 4 cycles Administration: 150 mg (12/20/2017), 150 mg (12/27/2017), 150 mg (01/17/2018), 150 mg (01/10/2018), 150 mg (01/25/2018), 150 mg (02/15/2018), 150 mg (02/08/2018) fosaprepitant (EMEND) 150 mg, dexamethasone (DECADRON) 12 mg in sodium chloride 0.9 % 145 mL IVPB, , Intravenous,  Once, 5 of 5 cycles Administration:  (12/20/2017),  (12/05/2017)  for chemotherapy treatment.       10/24/2017 -  Neo-Adjuvant Chemotherapy  Dose dense AC for 4 cycles followed by weekly carboplatin and paclitaxel for 12 weeks        CANCER STAGING: Cancer Staging Malignant neoplasm of upper-outer quadrant of left breast in female, estrogen receptor negative ([place]) Staging form: Breast, AJCC 8th Edition - Clinical: Stage IIIC (cT1, cN2, cM0, G3, ER: Negative, PR: Negative, HER2: Negative) -  Signed by Holley Bouche, NP on 11/28/2017    INTERVAL HISTORY:  Brenda Richmond 67 y.o. female returns for routine follow-up and consideration for next cycle of chemotherapy.   She is due for week 8 of chemotherapy today.  She reports tolerating chemotherapy very well.  Her mouth dries up for couple of days after each treatment.  She is tolerating Neupogen injections very well.  She goes to Dr. Marin Olp at [facility] office every Wednesday to check her blood counts.  She denies any nausea, vomiting, diarrhea or constipation.  Denies any neuropathy.  She feels that her nails are getting somewhat weaker.  Appetite has been good.    REVIEW OF SYSTEMS:  Review of Systems  Gastrointestinal: Positive for constipation.  All other systems reviewed and are negative.    PAST MEDICAL/SURGICAL HISTORY:  Past Medical History:  Diagnosis Date  . H/O seasonal allergies   . Hypertension   . Thrombosis    in right arm developed after 2nd round of chemotherapy   Past Surgical History:  Procedure Laterality Date  . BUNIONECTOMY Bilateral   . CYSTECTOMY     removed from left breast  . MELANOMA EXCISION Left    posterior forearm  . PORTA CATH INSERTION    . TUBAL LIGATION       SOCIAL HISTORY:  Social History   Socioeconomic History  . Marital status: Divorced    Spouse name: Not on file  . Number of children: Not on file  . Years of education: Not on file  . Highest education level: Not on file  Occupational History  . Not on file  Social Needs  . Financial resource strain: Not on file  . Food insecurity:    Worry: Not on file    Inability: Not on file  . Transportation needs:    Medical: Not on file    Non-medical: Not on file  Tobacco Use  . Smoking status: Never Smoker  . Smokeless tobacco: Never Used  Substance and Sexual Activity  . Alcohol use: Never    Frequency: Never  . Drug use: Never  . Sexual activity: Not Currently  Lifestyle  . Physical activity:    Days per  week: Not on file    Minutes per session: Not on file  . Stress: Not on file  Relationships  . Social connections:    Talks on phone: Not on file    Gets together: Not on file    Attends religious service: Not on file    Active member of club or organization: Not on file    Attends meetings of clubs or organizations: Not on file    Relationship status: Not on file  . Intimate partner violence:    Fear of current or ex partner: Not on file    Emotionally abused: Not on file    Physically abused: Not on file    Forced sexual activity: Not on file  Other Topics Concern  . Not on file  Social History Narrative  . Not on file    FAMILY HISTORY:  History reviewed. No pertinent family history.  CURRENT MEDICATIONS:  Outpatient Encounter Medications as of 02/22/2018  Medication Sig  . amLODipine (NORVASC) 5 MG tablet Take 1 tablet by mouth daily.  Marland Kitchen apixaban (ELIQUIS) 5 MG TABS tablet Take 1 tablet (5 mg total) by mouth 2 (two) times daily.  Marland Kitchen atorvastatin (LIPITOR) 10 MG tablet Take 1 tablet by mouth daily.  Marland Kitchen CALCIUM PO Take 2 tablets by mouth daily.  Marland Kitchen ELIQUIS 5 MG TABS tablet Take 5 mg by mouth 2 (two) times daily.  . fexofenadine (ALLEGRA) 60 MG tablet Take 1 tablet by mouth daily as needed.  . hydrochlorothiazide (HYDRODIURIL) 25 MG tablet Take 1 tablet by mouth daily.  Marland Kitchen ibuprofen (ADVIL,MOTRIN) 200 MG tablet Take 2 tablets by mouth 3 (three) times daily as needed.  Marland Kitchen KLOR-CON M20 20 MEQ tablet Take 20 mEq by mouth daily.  . ondansetron (ZOFRAN) 4 MG tablet Take 4 mg by mouth 3 (three) times daily as needed.  Marland Kitchen oxyCODONE-acetaminophen (PERCOCET/ROXICET) 5-325 MG tablet Take 1-2 tablets by mouth every 4 (four) hours as needed. for pain  . prochlorperazine (COMPAZINE) 10 MG tablet Take 10 mg by mouth every 6 (six) hours as needed.  . ramipril (ALTACE) 5 MG capsule Take 1 capsule by mouth daily.  . Vitamin D, Ergocalciferol, (DRISDOL) 50000 units CAPS capsule Take 1 capsule by  mouth once a week. On Mondays   No facility-administered encounter medications on file as of 02/22/2018.     ALLERGIES:  Allergies  Allergen Reactions  . Penicillin G      PHYSICAL EXAM:  ECOG Performance status: 1  Vitals:   02/22/18 0912  BP: 136/83  Pulse: (!) 110  Resp: 18  Temp: 98.1 F (36.7 C)  SpO2: 100%   Filed Weights   02/22/18 0912  Weight: 185 lb (83.9 kg)    Physical Exam Deferred.  LABORATORY DATA:  I have reviewed the labs as listed.  CBC    Component Value Date/Time   WBC 9.2 02/22/2018 0823   RBC 4.14 02/22/2018 0823   HGB 11.6 (L) 02/22/2018 0823   HGB 12.0 02/06/2018 1500   HCT 36.8 02/22/2018 0823   PLT 208 02/22/2018 0823   PLT 257 02/06/2018 1500   MCV 88.9 02/22/2018 0823   MCH 28.0 02/22/2018 0823   MCHC 31.5 02/22/2018 0823   RDW 13.9 02/22/2018 0823   LYMPHSABS 0.7 02/22/2018 0823   MONOABS 0.5 02/22/2018 0823   EOSABS 0.1 02/22/2018 0823   BASOSABS 0.0 02/22/2018 0823   CMP Latest Ref Rng & Units 02/22/2018 02/20/2018 02/15/2018  Glucose 65 - 99 mg/dL 105(H) 99 114(H)  BUN 6 - 20 mg/dL _0 Creatinine 0.44 - 1.00 mg/dL 0.63 0.80 0.67  Sodium 135 - 145 mmol/L 137 141 139  Potassium 3.5 - 5.1 mmol/L 3.7 3.8 3.4(L)  Chloride 101 - 111 mmol/L 102 103 101  CO2 22 - 32 mmol/L _1 Calcium 8.9 - 10.3 mg/dL 10.0 9.7 10.0  Total Protein 6.5 - 8.1 g/dL 7.0 7.0 7.1  Total Bilirubin 0.3 - 1.2 mg/dL 0.7 1.2 0.9  Alkaline Phos 38 - 126 U/L 81 81 83  AST 15 - 41 U/L _2 ALT 14 - 54 U/L 30 35 21         ASSESSMENT & PLAN:   Malignant neoplasm of upper-outer quadrant of left breast in female, estrogen receptor negative ([place]) 1.  Clinical stage IIIc left breast TNBC:  - She completed 4 cycles of dose dense AC.  She  is currently receiving weekly paclitaxel with carboplatin. -She is tolerating weekly Taxol with carboplatin with the help of G-CSF support.  She goes to Dr. Antonieta Pert office in [facility] 2 days prior to  each cycle to receive G-CSF.  Today her blood counts are adequate to proceed with week 8 of Taxol and carboplatin.  I will see her every other week.  She has a dentist appointment for regular cleaning next Wednesday.  2.  Genetic testing: She had extensive family history of malignancies.  We have done genetic testing for commonly inheritable genetic mutations, it was reported negative.      Orders placed this encounter:  No orders of the defined types were placed in this encounter.     Derek Jack, MD [place] [phone]

## 2018-02-22 NOTE — Progress Notes (Signed)
Tolerated infusions w/o adverse reaction.  Alert, in no distress.  VSS.  Discharged ambulatory in c/o family.  

## 2018-02-22 NOTE — Assessment & Plan Note (Signed)
1.  Clinical stage IIIc left breast TNBC:  - She completed 4 cycles of dose dense AC.  She is currently receiving weekly paclitaxel with carboplatin. -She is tolerating weekly Taxol with carboplatin with the help of G-CSF support.  She goes to Dr. Antonieta Pert office in [facility] 2 days prior to each cycle to receive G-CSF.  Today her blood counts are adequate to proceed with week 8 of Taxol and carboplatin.  I will see her every other week.  She has a dentist appointment for regular cleaning next Wednesday.  2.  Genetic testing: She had extensive family history of malignancies.  We have done genetic testing for commonly inheritable genetic mutations, it was reported negative.

## 2018-02-27 ENCOUNTER — Telehealth: Payer: Self-pay | Admitting: *Deleted

## 2018-02-27 ENCOUNTER — Other Ambulatory Visit (HOSPITAL_COMMUNITY): Payer: Self-pay | Admitting: Hematology

## 2018-02-27 ENCOUNTER — Inpatient Hospital Stay: Payer: Managed Care, Other (non HMO)

## 2018-02-27 VITALS — BP 148/79 | HR 85 | Temp 98.7°F | Resp 20

## 2018-02-27 DIAGNOSIS — Z171 Estrogen receptor negative status [ER-]: Secondary | ICD-10-CM

## 2018-02-27 DIAGNOSIS — D702 Other drug-induced agranulocytosis: Principal | ICD-10-CM

## 2018-02-27 DIAGNOSIS — C50412 Malignant neoplasm of upper-outer quadrant of left female breast: Secondary | ICD-10-CM

## 2018-02-27 DIAGNOSIS — T451X5S Adverse effect of antineoplastic and immunosuppressive drugs, sequela: Principal | ICD-10-CM

## 2018-02-27 LAB — CBC WITH DIFFERENTIAL/PLATELET
BASOS PCT: 1 %
Basophils Absolute: 0 10*3/uL (ref 0.0–0.1)
Eosinophils Absolute: 0 10*3/uL (ref 0.0–0.5)
Eosinophils Relative: 2 %
HEMATOCRIT: 35.3 % (ref 34.8–46.6)
Hemoglobin: 11.4 g/dL — ABNORMAL LOW (ref 11.6–15.9)
LYMPHS ABS: 0.4 10*3/uL — AB (ref 0.9–3.3)
LYMPHS PCT: 46 %
MCH: 28.4 pg (ref 26.0–34.0)
MCHC: 32.3 g/dL (ref 32.0–36.0)
MCV: 88 fL (ref 81.0–101.0)
MONO ABS: 0.1 10*3/uL (ref 0.1–0.9)
MONOS PCT: 14 %
NEUTROS ABS: 0.3 10*3/uL — AB (ref 1.5–6.5)
Neutrophils Relative %: 37 %
Platelets: 229 10*3/uL (ref 145–400)
RBC: 4.01 MIL/uL (ref 3.70–5.32)
RDW: 13.3 % (ref 11.1–15.7)
WBC: 0.9 10*3/uL — CL (ref 3.9–10.0)

## 2018-02-27 LAB — COMPREHENSIVE METABOLIC PANEL
ALK PHOS: 74 U/L (ref 40–150)
ALT: 35 U/L (ref 0–55)
ANION GAP: 7 (ref 3–11)
AST: 24 U/L (ref 5–34)
Albumin: 4.1 g/dL (ref 3.5–5.0)
BILIRUBIN TOTAL: 1 mg/dL (ref 0.2–1.2)
BUN: 12 mg/dL (ref 7–26)
CALCIUM: 10 mg/dL (ref 8.4–10.4)
CO2: 28 mmol/L (ref 22–29)
Chloride: 101 mmol/L (ref 98–109)
Creatinine, Ser: 0.74 mg/dL (ref 0.60–1.10)
GFR calc Af Amer: >60 mL/min (ref >60)
GFR calc non Af Amer: >60 mL/min (ref >60)
Glucose, Bld: 97 mg/dL (ref 70–140)
POTASSIUM: 4.1 mmol/L (ref 3.5–5.1)
Sodium: 136 mmol/L (ref 136–145)
TOTAL PROTEIN: 7.1 g/dL (ref 6.4–8.3)

## 2018-02-27 MED ORDER — TBO-FILGRASTIM 480 MCG/0.8ML ~~LOC~~ SOSY
480.0000 ug | PREFILLED_SYRINGE | Freq: Once | SUBCUTANEOUS | Status: AC
  Administered 2018-02-27: 480 ug via SUBCUTANEOUS
  Filled 2018-02-27: qty 0.8

## 2018-02-27 MED ORDER — FILGRASTIM 480 MCG/0.8ML IJ SOSY: 480.0000 ug | PREFILLED_SYRINGE | Freq: Once | INTRAMUSCULAR | Status: DC

## 2018-02-27 NOTE — Progress Notes (Signed)
Neutropenic precautions reviewed with patient.  Teach back done. Mask placed on patient and given to patient to take home.

## 2018-02-27 NOTE — Telephone Encounter (Signed)
Critical Value WBC 0.9  ANC 0.3 Dr Delton Coombes notified. No additional orders at this time

## 2018-03-01 ENCOUNTER — Encounter (HOSPITAL_COMMUNITY): Payer: Self-pay

## 2018-03-01 ENCOUNTER — Inpatient Hospital Stay (HOSPITAL_COMMUNITY): Payer: Managed Care, Other (non HMO)

## 2018-03-01 ENCOUNTER — Other Ambulatory Visit: Payer: Self-pay

## 2018-03-01 VITALS — BP 129/70 | HR 84 | Temp 98.0°F | Resp 16 | Wt 183.6 lb

## 2018-03-01 DIAGNOSIS — C50412 Malignant neoplasm of upper-outer quadrant of left female breast: Secondary | ICD-10-CM

## 2018-03-01 DIAGNOSIS — Z171 Estrogen receptor negative status [ER-]: Principal | ICD-10-CM

## 2018-03-01 LAB — CBC WITH DIFFERENTIAL/PLATELET
Basophils Absolute: 0.1 10*3/uL (ref 0.0–0.1)
Basophils Relative: 1 %
Eosinophils Absolute: 0.1 10*3/uL (ref 0.0–0.7)
Eosinophils Relative: 1 %
HEMATOCRIT: 35.6 % — AB (ref 36.0–46.0)
Hemoglobin: 11.2 g/dL — ABNORMAL LOW (ref 12.0–15.0)
LYMPHS ABS: 0.9 10*3/uL (ref 0.7–4.0)
Lymphocytes Relative: 10 %
MCH: 28.3 pg (ref 26.0–34.0)
MCHC: 31.5 g/dL (ref 30.0–36.0)
MCV: 89.9 fL (ref 78.0–100.0)
MONO ABS: 0.7 10*3/uL (ref 0.1–1.0)
MONOS PCT: 8 %
Neutro Abs: 6.7 10*3/uL (ref 1.7–7.7)
Neutrophils Relative %: 80 %
PLATELETS: 251 10*3/uL (ref 150–400)
RBC: 3.96 MIL/uL (ref 3.87–5.11)
RDW: 14 % (ref 11.5–15.5)
WBC: 8.5 10*3/uL (ref 4.0–10.5)

## 2018-03-01 LAB — COMPREHENSIVE METABOLIC PANEL
ALT: 30 U/L (ref 14–54)
AST: 19 U/L (ref 15–41)
Albumin: 4 g/dL (ref 3.5–5.0)
Alkaline Phosphatase: 76 U/L (ref 38–126)
Anion gap: 7 (ref 5–15)
BILIRUBIN TOTAL: 0.9 mg/dL (ref 0.3–1.2)
BUN: 13 mg/dL (ref 6–20)
CO2: 29 mmol/L (ref 22–32)
Calcium: 9.8 mg/dL (ref 8.9–10.3)
Chloride: 103 mmol/L (ref 101–111)
Creatinine, Ser: 0.55 mg/dL (ref 0.44–1.00)
GFR calc Af Amer: >60 mL/min (ref >60)
Glucose, Bld: 102 mg/dL — ABNORMAL HIGH (ref 65–99)
POTASSIUM: 4 mmol/L (ref 3.5–5.1)
Sodium: 139 mmol/L (ref 135–145)
TOTAL PROTEIN: 6.9 g/dL (ref 6.5–8.1)

## 2018-03-01 MED ORDER — SODIUM CHLORIDE 0.9 % IV SOLN
80.0000 mg/m2 | Freq: Once | INTRAVENOUS | Status: AC
  Administered 2018-03-01: 150 mg via INTRAVENOUS
  Filled 2018-03-01: qty 25

## 2018-03-01 MED ORDER — SODIUM CHLORIDE 0.9% FLUSH
10.0000 mL | INTRAVENOUS | Status: DC | PRN
  Administered 2018-03-01: 10 mL
  Filled 2018-03-01: qty 10

## 2018-03-01 MED ORDER — DIPHENHYDRAMINE HCL 50 MG/ML IJ SOLN
50.0000 mg | Freq: Once | INTRAMUSCULAR | Status: AC
  Administered 2018-03-01: 50 mg via INTRAVENOUS
  Filled 2018-03-01: qty 1

## 2018-03-01 MED ORDER — SODIUM CHLORIDE 0.9 % IV SOLN
150.0000 mg | Freq: Once | INTRAVENOUS | Status: AC
  Administered 2018-03-01: 150 mg via INTRAVENOUS
  Filled 2018-03-01: qty 15

## 2018-03-01 MED ORDER — SODIUM CHLORIDE 0.9 % IV SOLN
Freq: Once | INTRAVENOUS | Status: AC
  Administered 2018-03-01: 13:00:00 via INTRAVENOUS

## 2018-03-01 MED ORDER — FAMOTIDINE IN NACL 20-0.9 MG/50ML-% IV SOLN
20.0000 mg | Freq: Once | INTRAVENOUS | Status: AC
  Administered 2018-03-01: 20 mg via INTRAVENOUS
  Filled 2018-03-01: qty 50

## 2018-03-01 MED ORDER — DEXAMETHASONE SODIUM PHOSPHATE 100 MG/10ML IJ SOLN
20.0000 mg | Freq: Once | INTRAMUSCULAR | Status: AC
  Administered 2018-03-01: 20 mg via INTRAVENOUS
  Filled 2018-03-01: qty 2

## 2018-03-01 MED ORDER — HEPARIN SOD (PORK) LOCK FLUSH 100 UNIT/ML IV SOLN
500.0000 [IU] | Freq: Once | INTRAVENOUS | Status: AC | PRN
  Administered 2018-03-01: 500 [IU]
  Filled 2018-03-01: qty 5

## 2018-03-01 MED ORDER — PALONOSETRON HCL INJECTION 0.25 MG/5ML
0.2500 mg | Freq: Once | INTRAVENOUS | Status: AC
  Administered 2018-03-01: 0.25 mg via INTRAVENOUS
  Filled 2018-03-01: qty 5

## 2018-03-01 NOTE — Patient Instructions (Signed)
[facility] Discharge Instructions for Patients Receiving Chemotherapy   Beginning January 23rd 2017 lab work for the Cancer Center will be done in the  Main lab at [place] on 1st floor. If you have a lab appointment with the Cancer Center please come in thru the  Main Entrance and check in at the main information desk   Today you received the following chemotherapy agents   To help prevent nausea and vomiting after your treatment, we encourage you to take your nausea medication     If you develop nausea and vomiting, or diarrhea that is not controlled by your medication, call the clinic.  The clinic phone number is [phone]. Office hours are Monday-Friday 8:30am-5:00pm.  BELOW ARE SYMPTOMS THAT SHOULD BE REPORTED IMMEDIATELY:  *FEVER GREATER THAN 101.0 F  *CHILLS WITH OR WITHOUT FEVER  NAUSEA AND VOMITING THAT IS NOT CONTROLLED WITH YOUR NAUSEA MEDICATION  *UNUSUAL SHORTNESS OF BREATH  *UNUSUAL BRUISING OR BLEEDING  TENDERNESS IN MOUTH AND THROAT WITH OR WITHOUT PRESENCE OF ULCERS  *URINARY PROBLEMS  *BOWEL PROBLEMS  UNUSUAL RASH Items with * indicate a potential emergency and should be followed up as soon as possible. If you have an emergency after office hours please contact your primary care physician or go to the nearest emergency department.  Please call the clinic during office hours if you have any questions or concerns.   You may also contact the Patient Navigator at [phone] should you have any questions or need assistance in obtaining follow up care.      Resources For Cancer Patients and their Caregivers ? American Cancer Society: Can assist with transportation, wigs, general needs, runs Look Good Feel Better.        1-888-227-6333 ? Cancer Care: Provides financial assistance, online support groups, medication/co-pay assistance.  1-800-813-HOPE (4673) ? [facility] Assists [place] cancer  patients and their families through emotional , educational and financial support.  336-427-4357 ? [place] DSS Where to apply for food stamps, Medicaid and utility assistance. 336-342-1394 ? [facility]: Transportation to medical appointments. 336-347-2287 ? Social Security Administration: May apply for disability if have a Stage IV cancer. 336-342-7796 1-800-772-1213 ? [place] Aging, Disability and Transit Services: Assists with nutrition, care and transit needs. 336-349-2343         

## 2018-03-01 NOTE — Progress Notes (Signed)
Labs within parameters for treatment today.  Proceed with treatment.  Treatment given per orders. Patient tolerated it well without problems. Vitals stable and discharged home from clinic ambulatory. Follow up as scheduled.

## 2018-03-05 ENCOUNTER — Other Ambulatory Visit (HOSPITAL_COMMUNITY): Payer: Self-pay | Admitting: Pharmacist

## 2018-03-05 DIAGNOSIS — D702 Other drug-induced agranulocytosis: Principal | ICD-10-CM

## 2018-03-05 DIAGNOSIS — D701 Agranulocytosis secondary to cancer chemotherapy: Secondary | ICD-10-CM

## 2018-03-05 DIAGNOSIS — T451X5S Adverse effect of antineoplastic and immunosuppressive drugs, sequela: Principal | ICD-10-CM

## 2018-03-06 ENCOUNTER — Telehealth: Payer: Self-pay | Admitting: *Deleted

## 2018-03-06 ENCOUNTER — Inpatient Hospital Stay: Payer: Managed Care, Other (non HMO)

## 2018-03-06 ENCOUNTER — Other Ambulatory Visit: Payer: Self-pay

## 2018-03-06 VITALS — BP 146/80 | HR 83 | Temp 99.3°F

## 2018-03-06 DIAGNOSIS — C50412 Malignant neoplasm of upper-outer quadrant of left female breast: Secondary | ICD-10-CM | POA: Diagnosis not present

## 2018-03-06 DIAGNOSIS — Z171 Estrogen receptor negative status [ER-]: Secondary | ICD-10-CM

## 2018-03-06 DIAGNOSIS — D702 Other drug-induced agranulocytosis: Principal | ICD-10-CM

## 2018-03-06 DIAGNOSIS — T451X5S Adverse effect of antineoplastic and immunosuppressive drugs, sequela: Principal | ICD-10-CM

## 2018-03-06 LAB — CBC WITH DIFFERENTIAL/PLATELET
BASOS ABS: 0 10*3/uL (ref 0.0–0.1)
Basophils Relative: 1 %
EOS ABS: 0 10*3/uL (ref 0.0–0.5)
EOS PCT: 3 %
HCT: 35.1 % (ref 34.8–46.6)
Hemoglobin: 11.4 g/dL — ABNORMAL LOW (ref 11.6–15.9)
LYMPHS ABS: 0.5 10*3/uL — AB (ref 0.9–3.3)
Lymphocytes Relative: 42 %
MCH: 28.7 pg (ref 26.0–34.0)
MCHC: 32.5 g/dL (ref 32.0–36.0)
MCV: 88.4 fL (ref 81.0–101.0)
Monocytes Absolute: 0.2 10*3/uL (ref 0.1–0.9)
Monocytes Relative: 18 %
Neutro Abs: 0.4 10*3/uL — CL (ref 1.5–6.5)
Neutrophils Relative %: 36 %
Platelets: 246 10*3/uL (ref 145–400)
RBC: 3.97 MIL/uL (ref 3.70–5.32)
RDW: 13.5 % (ref 11.1–15.7)
WBC: 1.2 10*3/uL — AB (ref 3.9–10.0)

## 2018-03-06 MED ORDER — TBO-FILGRASTIM 480 MCG/0.8ML ~~LOC~~ SOSY
480.0000 ug | PREFILLED_SYRINGE | Freq: Once | SUBCUTANEOUS | Status: AC
  Administered 2018-03-06: 480 ug via SUBCUTANEOUS
  Filled 2018-03-06: qty 0.8

## 2018-03-06 MED ORDER — FILGRASTIM 480 MCG/0.8ML IJ SOSY: 480.0000 ug | PREFILLED_SYRINGE | Freq: Once | INTRAMUSCULAR | Status: DC

## 2018-03-06 NOTE — Telephone Encounter (Signed)
Critical Value ANC 0.4 Dr Delton Coombes notified. No orders received.

## 2018-03-06 NOTE — Patient Instructions (Signed)
Tbo-Filgrastim injection What is this medicine? TBO-FILGRASTIM (T B O fil GRA stim) is a granulocyte colony-stimulating factor that stimulates the growth of neutrophils, a type of white blood cell important in the body's fight against infection. It is used to reduce the incidence of fever and infection in patients with certain types of cancer who are receiving chemotherapy that affects the bone marrow. This medicine may be used for other purposes; ask your health care provider or pharmacist if you have questions. COMMON BRAND NAME(S): Granix What should I tell my health care provider before I take this medicine? They need to know if you have any of these conditions: -bone scan or tests planned -kidney disease -sickle cell anemia -an unusual or allergic reaction to tbo-filgrastim, filgrastim, pegfilgrastim, other medicines, foods, dyes, or preservatives -pregnant or trying to get pregnant -breast-feeding How should I use this medicine? This medicine is for injection under the skin. If you get this medicine at home, you will be taught how to prepare and give this medicine. Refer to the Instructions for Use that come with your medication packaging. Use exactly as directed. Take your medicine at regular intervals. Do not take your medicine more often than directed. It is important that you put your used needles and syringes in a special sharps container. Do not put them in a trash can. If you do not have a sharps container, call your pharmacist or healthcare provider to get one. Talk to your pediatrician regarding the use of this medicine in children. Special care may be needed. Overdosage: If you think you have taken too much of this medicine contact a poison control center or emergency room at once. NOTE: This medicine is only for you. Do not share this medicine with others. What if I miss a dose? It is important not to miss your dose. Call your doctor or health care professional if you miss a  dose. What may interact with this medicine? This medicine may interact with the following medications: -medicines that may cause a release of neutrophils, such as lithium This list may not describe all possible interactions. Give your health care provider a list of all the medicines, herbs, non-prescription drugs, or dietary supplements you use. Also tell them if you smoke, drink alcohol, or use illegal drugs. Some items may interact with your medicine. What should I watch for while using this medicine? You may need blood work done while you are taking this medicine. What side effects may I notice from receiving this medicine? Side effects that you should report to your doctor or health care professional as soon as possible: -allergic reactions like skin rash, itching or hives, swelling of the face, lips, or tongue -blood in the urine -dark urine -dizziness -fast heartbeat -feeling faint -shortness of breath or breathing problems -signs and symptoms of infection like fever or chills; cough; or sore throat -signs and symptoms of kidney injury like trouble passing urine or change in the amount of urine -stomach or side pain, or pain at the shoulder -sweating -swelling of the legs, ankles, or abdomen -tiredness Side effects that usually do not require medical attention (report to your doctor or health care professional if they continue or are bothersome): -bone pain -headache -muscle pain -vomiting This list may not describe all possible side effects. Call your doctor for medical advice about side effects. You may report side effects to FDA at 1-800-FDA-1088. Where should I keep my medicine? Keep out of the reach of children. Store in a refrigerator between   2 and 8 degrees C (36 and 46 degrees F). Keep in carton to protect from light. Throw away this medicine if it is left out of the refrigerator for more than 5 consecutive days. Throw away any unused medicine after the expiration  date. NOTE: This sheet is a summary. It may not cover all possible information. If you have questions about this medicine, talk to your doctor, pharmacist, or health care provider.  2018 Elsevier/Gold Standard (2015-10-18 19:07:04)  

## 2018-03-08 ENCOUNTER — Encounter (HOSPITAL_COMMUNITY): Payer: Self-pay | Admitting: Hematology

## 2018-03-08 ENCOUNTER — Inpatient Hospital Stay (HOSPITAL_COMMUNITY): Payer: Managed Care, Other (non HMO)

## 2018-03-08 ENCOUNTER — Inpatient Hospital Stay (HOSPITAL_BASED_OUTPATIENT_CLINIC_OR_DEPARTMENT_OTHER): Payer: Managed Care, Other (non HMO) | Admitting: Hematology

## 2018-03-08 VITALS — BP 145/71 | HR 103 | Temp 98.3°F | Resp 14 | Wt 184.5 lb

## 2018-03-08 VITALS — BP 112/69 | HR 85 | Temp 98.1°F | Resp 18

## 2018-03-08 DIAGNOSIS — K5900 Constipation, unspecified: Secondary | ICD-10-CM | POA: Diagnosis not present

## 2018-03-08 DIAGNOSIS — C50412 Malignant neoplasm of upper-outer quadrant of left female breast: Secondary | ICD-10-CM

## 2018-03-08 DIAGNOSIS — Z171 Estrogen receptor negative status [ER-]: Principal | ICD-10-CM

## 2018-03-08 DIAGNOSIS — Z880 Allergy status to penicillin: Secondary | ICD-10-CM

## 2018-03-08 DIAGNOSIS — Z7901 Long term (current) use of anticoagulants: Secondary | ICD-10-CM

## 2018-03-08 DIAGNOSIS — R58 Hemorrhage, not elsewhere classified: Secondary | ICD-10-CM | POA: Diagnosis not present

## 2018-03-08 DIAGNOSIS — C50411 Malignant neoplasm of upper-outer quadrant of right female breast: Secondary | ICD-10-CM

## 2018-03-08 DIAGNOSIS — L299 Pruritus, unspecified: Secondary | ICD-10-CM

## 2018-03-08 DIAGNOSIS — I82621 Acute embolism and thrombosis of deep veins of right upper extremity: Secondary | ICD-10-CM

## 2018-03-08 DIAGNOSIS — C73 Malignant neoplasm of thyroid gland: Secondary | ICD-10-CM

## 2018-03-08 DIAGNOSIS — Z8042 Family history of malignant neoplasm of prostate: Secondary | ICD-10-CM

## 2018-03-08 DIAGNOSIS — I10 Essential (primary) hypertension: Secondary | ICD-10-CM

## 2018-03-08 DIAGNOSIS — Z803 Family history of malignant neoplasm of breast: Secondary | ICD-10-CM

## 2018-03-08 DIAGNOSIS — R040 Epistaxis: Secondary | ICD-10-CM

## 2018-03-08 DIAGNOSIS — Z79899 Other long term (current) drug therapy: Secondary | ICD-10-CM

## 2018-03-08 DIAGNOSIS — Z86718 Personal history of other venous thrombosis and embolism: Secondary | ICD-10-CM

## 2018-03-08 LAB — COMPREHENSIVE METABOLIC PANEL
ALT: 24 U/L (ref 0–44)
AST: 20 U/L (ref 15–41)
Albumin: 4.2 g/dL (ref 3.5–5.0)
Alkaline Phosphatase: 84 U/L (ref 38–126)
Anion gap: 8 (ref 5–15)
BUN: 11 mg/dL (ref 8–23)
CO2: 28 mmol/L (ref 22–32)
Calcium: 10 mg/dL (ref 8.9–10.3)
Chloride: 103 mmol/L (ref 98–111)
Creatinine, Ser: 0.62 mg/dL (ref 0.44–1.00)
GFR calc Af Amer: >60 mL/min (ref >60)
GFR calc non Af Amer: >60 mL/min (ref >60)
Glucose, Bld: 105 mg/dL — ABNORMAL HIGH (ref 70–99)
Potassium: 3.9 mmol/L (ref 3.5–5.1)
Sodium: 139 mmol/L (ref 135–145)
Total Bilirubin: 0.7 mg/dL (ref 0.3–1.2)
Total Protein: 7.3 g/dL (ref 6.5–8.1)

## 2018-03-08 LAB — CBC WITH DIFFERENTIAL/PLATELET
BASOS PCT: 0 %
Basophils Absolute: 0 10*3/uL (ref 0.0–0.1)
EOS PCT: 1 %
Eosinophils Absolute: 0.1 10*3/uL (ref 0.0–0.7)
HEMATOCRIT: 37 % (ref 36.0–46.0)
Hemoglobin: 11.8 g/dL — ABNORMAL LOW (ref 12.0–15.0)
LYMPHS ABS: 1.1 10*3/uL (ref 0.7–4.0)
Lymphocytes Relative: 8 %
MCH: 28.7 pg (ref 26.0–34.0)
MCHC: 31.9 g/dL (ref 30.0–36.0)
MCV: 90 fL (ref 78.0–100.0)
MONO ABS: 0.7 10*3/uL (ref 0.1–1.0)
Monocytes Relative: 5 %
NEUTROS ABS: 11.9 10*3/uL — AB (ref 1.7–7.7)
Neutrophils Relative %: 86 %
PLATELETS: 254 10*3/uL (ref 150–400)
RBC: 4.11 MIL/uL (ref 3.87–5.11)
RDW: 14.2 % (ref 11.5–15.5)
WBC: 13.8 10*3/uL — ABNORMAL HIGH (ref 4.0–10.5)

## 2018-03-08 MED ORDER — SODIUM CHLORIDE 0.9 % IV SOLN
Freq: Once | INTRAVENOUS | Status: AC
  Administered 2018-03-08: 10:00:00 via INTRAVENOUS

## 2018-03-08 MED ORDER — CARBOPLATIN CHEMO INJECTION 450 MG/45ML
100.0000 mg | Freq: Once | INTRAVENOUS | Status: AC
  Administered 2018-03-08: 100 mg via INTRAVENOUS
  Filled 2018-03-08: qty 10

## 2018-03-08 MED ORDER — DIPHENHYDRAMINE HCL 50 MG/ML IJ SOLN
50.0000 mg | Freq: Once | INTRAMUSCULAR | Status: AC
  Administered 2018-03-08: 50 mg via INTRAVENOUS
  Filled 2018-03-08: qty 1

## 2018-03-08 MED ORDER — SODIUM CHLORIDE 0.9 % IV SOLN
80.0000 mg/m2 | Freq: Once | INTRAVENOUS | Status: AC
  Administered 2018-03-08: 150 mg via INTRAVENOUS
  Filled 2018-03-08: qty 25

## 2018-03-08 MED ORDER — HEPARIN SOD (PORK) LOCK FLUSH 100 UNIT/ML IV SOLN
500.0000 [IU] | Freq: Once | INTRAVENOUS | Status: AC | PRN
  Administered 2018-03-08: 500 [IU]
  Filled 2018-03-08 (×3): qty 5

## 2018-03-08 MED ORDER — APIXABAN 2.5 MG PO TABS: 2.5000 mg | ORAL_TABLET | Freq: Two times a day (BID) | ORAL | 3 refills | Status: DC

## 2018-03-08 MED ORDER — SODIUM CHLORIDE 0.9 % IV SOLN
20.0000 mg | Freq: Once | INTRAVENOUS | Status: AC
  Administered 2018-03-08: 20 mg via INTRAVENOUS
  Filled 2018-03-08: qty 2

## 2018-03-08 MED ORDER — PALONOSETRON HCL INJECTION 0.25 MG/5ML
0.2500 mg | Freq: Once | INTRAVENOUS | Status: AC
  Administered 2018-03-08: 0.25 mg via INTRAVENOUS
  Filled 2018-03-08: qty 5

## 2018-03-08 MED ORDER — SODIUM CHLORIDE 0.9% FLUSH
10.0000 mL | INTRAVENOUS | Status: DC | PRN
  Administered 2018-03-08: 10 mL
  Filled 2018-03-08: qty 10

## 2018-03-08 MED ORDER — FAMOTIDINE IN NACL 20-0.9 MG/50ML-% IV SOLN
20.0000 mg | Freq: Once | INTRAVENOUS | Status: AC
  Administered 2018-03-08: 20 mg via INTRAVENOUS
  Filled 2018-03-08: qty 50

## 2018-03-08 NOTE — Patient Instructions (Signed)
[facility] Discharge Instructions for Patients Receiving Chemotherapy   Beginning January 23rd 2017 lab work for the Cancer Center will be done in the  Main lab at [place] on 1st floor. If you have a lab appointment with the Cancer Center please come in thru the  Main Entrance and check in at the main information desk   Today you received the following chemotherapy agents Taxol and Carboplatin. Follow-up as scheduled. Call clinic for any questions or concerns  To help prevent nausea and vomiting after your treatment, we encourage you to take your nausea medication.   If you develop nausea and vomiting, or diarrhea that is not controlled by your medication, call the clinic.  The clinic phone number is [phone]. Office hours are Monday-Friday 8:30am-5:00pm.  BELOW ARE SYMPTOMS THAT SHOULD BE REPORTED IMMEDIATELY:  *FEVER GREATER THAN 101.0 F  *CHILLS WITH OR WITHOUT FEVER  NAUSEA AND VOMITING THAT IS NOT CONTROLLED WITH YOUR NAUSEA MEDICATION  *UNUSUAL SHORTNESS OF BREATH  *UNUSUAL BRUISING OR BLEEDING  TENDERNESS IN MOUTH AND THROAT WITH OR WITHOUT PRESENCE OF ULCERS  *URINARY PROBLEMS  *BOWEL PROBLEMS  UNUSUAL RASH Items with * indicate a potential emergency and should be followed up as soon as possible. If you have an emergency after office hours please contact your primary care physician or go to the nearest emergency department.  Please call the clinic during office hours if you have any questions or concerns.   You may also contact the Patient Navigator at [phone] should you have any questions or need assistance in obtaining follow up care.      Resources For Cancer Patients and their Caregivers ? American Cancer Society: Can assist with transportation, wigs, general needs, runs Look Good Feel Better.        1-888-227-6333 ? Cancer Care: Provides financial assistance, online support groups, medication/co-pay assistance.   1-800-813-HOPE (4673) ? [facility] Assists [place] cancer patients and their families through emotional , educational and financial support.  336-427-4357 ? [place] DSS Where to apply for food stamps, Medicaid and utility assistance. 336-342-1394 ? [facility]: Transportation to medical appointments. 336-347-2287 ? Social Security Administration: May apply for disability if have a Stage IV cancer. 336-342-7796 1-800-772-1213 ? [place] Aging, Disability and Transit Services: Assists with nutrition, care and transit needs. 336-349-2343         

## 2018-03-08 NOTE — Progress Notes (Signed)
[place],  [id number]   CLINIC:  Medical Oncology/Hematology  PCP:  Eber Hong, MD [id number] [place] [address] [phone]   REASON FOR VISIT:  Follow-up for Chemotherapy for Breast Cancer  CURRENT THERAPY: Weekly Taxol and Carboplatin  BRIEF ONCOLOGIC HISTORY:    Malignant neoplasm of upper-outer quadrant of left breast in female, estrogen receptor negative ([place])   09/26/2017 Initial Biopsy    Biopsy of the left breast and left axillary lymph node consistent with high-grade TNBC, Ki-67 of 80%  Clinical stage T1cN2 M0      09/26/2017 Family History    Sister had breast cancer at age 28, HER-2 positive, multiple paternal uncles had prostate cancer, father had prostate cancer, 2 paternal cousins had breast cancer       10/11/2017 Echocardiogram    Echocardiogram on 10/11/2017 showed ejection fraction of 65-70%      10/18/2017 Imaging    CT scan of the chest, abdomen and pelvis shows tiny circumscribed noncalcified nodule in the posterior segment of the left upper lobe of the lung with no other evidence of metastatic disease  MRI of the abdomen with and without contrast ordered for elevated total bilirubin on 10/29/2017 shows no evidence of liver metastasis      10/22/2017 Genetic Testing    [employer] myRisk test for BRCA 1 and 2 and other mutations negative      10/23/2017 -  Chemotherapy    The patient had DOXOrubicin (ADRIAMYCIN) chemo injection 114 mg, 60 mg/m2, Intravenous,  Once, 4 of 4 cycles Administration: 114 mg (12/05/2017) palonosetron (ALOXI) injection 0.25 mg, 0.25 mg, Intravenous,  Once, 8 of 8 cycles Administration: 0.25 mg (12/20/2017), 0.25 mg (12/05/2017), 0.25 mg (01/17/2018), 0.25 mg (12/27/2017), 0.25 mg (01/10/2018), 0.25 mg (01/25/2018), 0.25 mg (02/15/2018), 0.25 mg (02/22/2018), 0.25 mg (03/01/2018), 0.25 mg (02/08/2018), 0.25 mg (03/08/2018) pegfilgrastim (NEULASTA ONPRO KIT) injection 6 mg, 6 mg,  Subcutaneous, Once, 1 of 1 cycle Administration: 6 mg (12/05/2017) filgrastim (NEUPOGEN) injection 480 mcg, 480 mcg, Subcutaneous, Once PRN, 1 of 1 cycle pegfilgrastim-cbqv (UDENYCA) injection 6 mg, 6 mg, Subcutaneous, Once, 3 of 3 cycles CARBOplatin (PARAPLATIN) 230 mg in sodium chloride 0.9 % 250 mL chemo infusion, 230 mg (100 % of original dose 230.8 mg), Intravenous,  Once, 4 of 4 cycles Dose modification:   (original dose 230.8 mg, Cycle 5), 100 mg (original dose 100 mg, Cycle 6),   (original dose 230.8 mg, Cycle 5), 184.64 mg (80 % of original dose 230.8 mg, Cycle 5, Reason: Provider Judgment), 150 mg (original dose 100 mg, Cycle 7, Reason: Provider Judgment),   (original dose 100 mg, Cycle 7, Reason: Provider Judgment), 150 mg (original dose 100 mg, Cycle 7, Reason: Provider Judgment), 150 mg (original dose 100 mg, Cycle 6, Reason: Provider Judgment) Administration: 230 mg (12/20/2017), 100 mg (01/17/2018), 180 mg (12/27/2017), 150 mg (02/15/2018), 200 mg (02/22/2018), 150 mg (03/01/2018), 150 mg (02/08/2018), 100 mg (03/08/2018) cyclophosphamide (CYTOXAN) 1,140 mg in sodium chloride 0.9 % 250 mL chemo infusion, 600 mg/m2, Intravenous,  Once, 4 of 4 cycles Administration: 1,140 mg (12/05/2017) PACLitaxel (TAXOL) 150 mg in dextrose 5 % 250 mL chemo infusion (</= 27m/m2), 80 mg/m2 = 150 mg, Intravenous,  Once, 4 of 4 cycles Administration: 150 mg (12/20/2017), 150 mg (12/27/2017), 150 mg (01/17/2018), 150 mg (01/10/2018), 150 mg (01/25/2018), 150 mg (02/15/2018), 150 mg (02/22/2018), 150 mg (03/01/2018), 150 mg (02/08/2018), 150 mg (03/08/2018) fosaprepitant (EMEND) 150 mg, dexamethasone (DECADRON) 12  mg in sodium chloride 0.9 % 145 mL IVPB, , Intravenous,  Once, 5 of 5 cycles Administration:  (12/20/2017),  (12/05/2017)  for chemotherapy treatment.       10/24/2017 -  Neo-Adjuvant Chemotherapy    Dose dense AC for 4 cycles followed by weekly carboplatin and paclitaxel for 12 weeks        CANCER STAGING: Cancer  Staging Malignant neoplasm of upper-outer quadrant of left breast in female, estrogen receptor negative ([place]) Staging form: Breast, AJCC 8th Edition - Clinical: Stage IIIC (cT1, cN2, cM0, G3, ER: Negative, PR: Negative, HER2: Negative) - Signed by Holley Bouche, NP on 11/28/2017    INTERVAL HISTORY:  Ms. Dearmond 67 y.o. female returns for routine follow-up and consideration for next cycle of chemotherapy.   Due for cycle week 10 of Chemotherapy today. She reports tolerating chemotherapy very well. Has complaint of itching of her hand and feet and constipation for a few days after treatment. Patient has had mild bruising since taking Eliquis and has reported nose bleeds. She sees Dr. Marin Olp in [facility] very Wednesday to monitor her blood counts. Overall, she tells me she has been feeling pretty well. Energy levels are 100%; appetite is 100%. She denies complaints of nausea, vomiting, or diarrhea. Denies Neuropathy or tingling in extremities. Overall, she feels ready for next cycle of chemo today.     REVIEW OF SYSTEMS:  Review of Systems  Constitutional: Negative.   HENT:   Positive for nosebleeds.   Respiratory: Negative.   Cardiovascular: Negative.   Gastrointestinal: Positive for constipation (Few days after treatment).  Skin: Negative for itching (hand and feet).  Neurological: Negative.   Hematological: Bruises/bleeds easily.     PAST MEDICAL/SURGICAL HISTORY:  Past Medical History:  Diagnosis Date  . Breast cancer ([place])   . H/O seasonal allergies   . Hypertension   . Thrombosis    in right arm developed after 2nd round of chemotherapy   Past Surgical History:  Procedure Laterality Date  . BUNIONECTOMY Bilateral   . CYSTECTOMY     removed from left breast  . MELANOMA EXCISION Left    posterior forearm  . PORTA CATH INSERTION    . TUBAL LIGATION       SOCIAL HISTORY:  Social History   Socioeconomic History  . Marital status: Divorced    Spouse name: Not on  file  . Number of children: Not on file  . Years of education: Not on file  . Highest education level: Not on file  Occupational History  . Not on file  Social Needs  . Financial resource strain: Not on file  . Food insecurity:    Worry: Not on file    Inability: Not on file  . Transportation needs:    Medical: Not on file    Non-medical: Not on file  Tobacco Use  . Smoking status: Never Smoker  . Smokeless tobacco: Never Used  Substance and Sexual Activity  . Alcohol use: Never    Frequency: Never  . Drug use: Never  . Sexual activity: Not Currently  Lifestyle  . Physical activity:    Days per week: Not on file    Minutes per session: Not on file  . Stress: Not on file  Relationships  . Social connections:    Talks on phone: Not on file    Gets together: Not on file    Attends religious service: Not on file    Active member of club or organization:  Not on file    Attends meetings of clubs or organizations: Not on file    Relationship status: Not on file  . Intimate partner violence:    Fear of current or ex partner: Not on file    Emotionally abused: Not on file    Physically abused: Not on file    Forced sexual activity: Not on file  Other Topics Concern  . Not on file  Social History Narrative  . Not on file    FAMILY HISTORY:  History reviewed. No pertinent family history.  CURRENT MEDICATIONS:  Outpatient Encounter Medications as of 03/08/2018  Medication Sig  . amLODipine (NORVASC) 5 MG tablet Take 1 tablet by mouth daily.  Marland Kitchen apixaban (ELIQUIS) 2.5 MG TABS tablet Take 1 tablet (2.5 mg total) by mouth 2 (two) times daily.  Marland Kitchen atorvastatin (LIPITOR) 10 MG tablet Take 1 tablet by mouth daily.  Marland Kitchen CALCIUM PO Take 2 tablets by mouth daily.  Marland Kitchen CALCIUM PO Take by mouth.  . fexofenadine (ALLEGRA) 60 MG tablet Take 1 tablet by mouth daily as needed.  . hydrochlorothiazide (HYDRODIURIL) 25 MG tablet Take 1 tablet by mouth daily.  Marland Kitchen ibuprofen (ADVIL,MOTRIN) 200 MG  tablet Take 2 tablets by mouth 3 (three) times daily as needed.  Marland Kitchen KLOR-CON M20 20 MEQ tablet Take 20 mEq by mouth daily.  . ondansetron (ZOFRAN) 4 MG tablet Take 4 mg by mouth 3 (three) times daily as needed.  Marland Kitchen oxyCODONE-acetaminophen (PERCOCET/ROXICET) 5-325 MG tablet Take 1-2 tablets by mouth every 4 (four) hours as needed. for pain  . prochlorperazine (COMPAZINE) 10 MG tablet Take 10 mg by mouth every 6 (six) hours as needed.  . ramipril (ALTACE) 5 MG capsule Take 1 capsule by mouth daily.  . Vitamin D, Ergocalciferol, (DRISDOL) 50000 units CAPS capsule Take 1 capsule by mouth once a week. On Mondays  . [DISCONTINUED] apixaban (ELIQUIS) 5 MG TABS tablet Take 1 tablet (5 mg total) by mouth 2 (two) times daily.   No facility-administered encounter medications on file as of 03/08/2018.     ALLERGIES:  Allergies  Allergen Reactions  . Penicillin G      PHYSICAL EXAM:  ECOG Performance status: 1  Vitals:   03/08/18 0905  BP: (!) 145/71  Pulse: (!) 103  Resp: 14  Temp: 98.3 F (36.8 C)  SpO2: 99%   Filed Weights   03/08/18 0905  Weight: 184 lb 8 oz (83.7 kg)    Physical Exam Skin examination showed ecchymosis on the right arm, left axillary region.  LABORATORY DATA:  I have reviewed the labs as listed.  CBC    Component Value Date/Time   WBC 13.8 (H) 03/08/2018 0841   RBC 4.11 03/08/2018 0841   HGB 11.8 (L) 03/08/2018 0841   HGB 12.0 02/06/2018 1500   HCT 37.0 03/08/2018 0841   PLT 254 03/08/2018 0841   PLT 257 02/06/2018 1500   MCV 90.0 03/08/2018 0841   MCH 28.7 03/08/2018 0841   MCHC 31.9 03/08/2018 0841   RDW 14.2 03/08/2018 0841   LYMPHSABS 1.1 03/08/2018 0841   MONOABS 0.7 03/08/2018 0841   EOSABS 0.1 03/08/2018 0841   BASOSABS 0.0 03/08/2018 0841   CMP Latest Ref Rng & Units 03/08/2018 03/01/2018 02/27/2018  Glucose 70 - 99 mg/dL 105(H) 102(H) 97  BUN 8 - 23 mg/dL _0 Creatinine 0.44 - 1.00 mg/dL 0.62 0.55 0.74  Sodium 135 - 145 mmol/L 139 139  136  Potassium 3.5 - 5.1  mmol/L 3.9 4.0 4.1  Chloride 98 - 111 mmol/L 103 103 101  CO2 22 - 32 mmol/L _0 Calcium 8.9 - 10.3 mg/dL 10.0 9.8 10.0  Total Protein 6.5 - 8.1 g/dL 7.3 6.9 7.1  Total Bilirubin 0.3 - 1.2 mg/dL 0.7 0.9 1.0  Alkaline Phos 38 - 126 U/L 84 76 74  AST 15 - 41 U/L _1 ALT 0 - 44 U/L 24 30 35          ASSESSMENT & PLAN:   Malignant neoplasm of upper-outer quadrant of left breast in female, estrogen receptor negative ([place]) 1.  Clinical stage IIIc left breast TNBC:  - She completed 4 cycles of dose dense AC.  She is currently receiving weekly paclitaxel with carboplatin. -She is tolerating weekly Taxol with carboplatin with the help of G-CSF support.  She goes to Dr. Antonieta Pert office in [facility] 2 days prior to each cycle to receive G-CSF.  She will proceed with week 10 of carboplatin and paclitaxel.  I have reviewed her blood counts which were adequate.  I will see her back in 2 weeks prior to her week 12 of treatment.  2.  Genetic testing: She had extensive family history of malignancies.  We have done genetic testing for commonly inheritable genetic mutations, it was reported negative.   3.  Right upper extremity DVT: -She was diagnosed with right upper extremity DVT when she had port placed.  This happened end of February.  She was started on Eliquis on November 09, 2017.  She is currently taking 5 mg twice a day.  She is having easy bruising.  She has finished 4 months of treatment.  Hence I would cut back on the dose of Eliquis to 2.5 mg twice daily.  She also reported blood-tinged nasal secretions.  We plan to discontinue Eliquis after port is removed.      Orders placed this encounter:  Orders Placed This Encounter  Procedures  . CBC with Differential/Platelet  . Comprehensive metabolic panel      Derek Jack, MD [place] [phone]

## 2018-03-08 NOTE — Progress Notes (Signed)
[place] reviewed with and pt seen by Dr. Delton Coombes and pt approved for chemo tx today per MD                                                                                  [facility] tolerated chemo tx well without complaints or incident. VSS upon discharge. Pt discharged self ambulatory in satisfactory condition accompanied by family member

## 2018-03-08 NOTE — Assessment & Plan Note (Signed)
1.  Clinical stage IIIc left breast TNBC:  - She completed 4 cycles of dose dense AC.  She is currently receiving weekly paclitaxel with carboplatin. -She is tolerating weekly Taxol with carboplatin with the help of G-CSF support.  She goes to Dr. Antonieta Pert office in [facility] 2 days prior to each cycle to receive G-CSF.  She will proceed with week 10 of carboplatin and paclitaxel.  I have reviewed her blood counts which were adequate.  I will see her back in 2 weeks prior to her week 12 of treatment.  2.  Genetic testing: She had extensive family history of malignancies.  We have done genetic testing for commonly inheritable genetic mutations, it was reported negative.   3.  Right upper extremity DVT: -She was diagnosed with right upper extremity DVT when she had port placed.  This happened end of February.  She was started on Eliquis on November 09, 2017.  She is currently taking 5 mg twice a day.  She is having easy bruising.  She has finished 4 months of treatment.  Hence I would cut back on the dose of Eliquis to 2.5 mg twice daily.  She also reported blood-tinged nasal secretions.  We plan to discontinue Eliquis after port is removed.

## 2018-03-08 NOTE — Patient Instructions (Signed)
[facility] at [facility] Discharge Instructions  You saw Dr. Katragadda today.   Thank you for choosing [facility] at [facility] to provide your oncology and hematology care.  To afford each patient quality time with our provider, please arrive at least 15 minutes before your scheduled appointment time.   If you have a lab appointment with the Cancer Center please come in thru the  Main Entrance and check in at the main information desk  You need to re-schedule your appointment should you arrive 10 or more minutes late.  We strive to give you quality time with our providers, and arriving late affects you and other patients whose appointments are after yours.  Also, if you no show three or more times for appointments you may be dismissed from the clinic at the providers discretion.     Again, thank you for choosing [facility].  Our hope is that these requests will decrease the amount of time that you wait before being seen by our physicians.       _____________________________________________________________  Should you have questions after your visit to [facility], please contact our office at [phone] between the hours of 8:30 a.m. and 4:30 p.m.  Voicemails left after 4:30 p.m. will not be returned until the following business day.  For prescription refill requests, have your pharmacy contact our office.       Resources For Cancer Patients and their Caregivers ? American Cancer Society: Can assist with transportation, wigs, general needs, runs Look Good Feel Better.        1-888-227-6333 ? Cancer Care: Provides financial assistance, online support groups, medication/co-pay assistance.  1-800-813-HOPE (4673) ? [facility] Assists [place] cancer patients and their families through emotional , educational and financial support.  336-427-4357 ? [place] DSS Where to apply for  food stamps, Medicaid and utility assistance. 336-342-1394 ? [facility]: Transportation to medical appointments. 336-347-2287 ? Social Security Administration: May apply for disability if have a Stage IV cancer. 336-342-7796 1-800-772-1213 ? [place] Aging, Disability and Transit Services: Assists with nutrition, care and transit needs. 336-349-2343  Cancer Center Support Programs:   > Cancer Support Group  2nd Tuesday of the month 1pm-2pm, Journey Room   > Creative Journey  3rd Tuesday of the month 1130am-1pm, Journey Room     

## 2018-03-13 ENCOUNTER — Other Ambulatory Visit: Payer: Self-pay

## 2018-03-13 ENCOUNTER — Inpatient Hospital Stay: Payer: Managed Care, Other (non HMO) | Attending: Hematology & Oncology

## 2018-03-13 ENCOUNTER — Inpatient Hospital Stay: Payer: Managed Care, Other (non HMO)

## 2018-03-13 VITALS — BP 125/73 | HR 80 | Temp 98.3°F | Resp 18

## 2018-03-13 DIAGNOSIS — D702 Other drug-induced agranulocytosis: Principal | ICD-10-CM

## 2018-03-13 DIAGNOSIS — D709 Neutropenia, unspecified: Secondary | ICD-10-CM | POA: Diagnosis not present

## 2018-03-13 DIAGNOSIS — C50412 Malignant neoplasm of upper-outer quadrant of left female breast: Secondary | ICD-10-CM | POA: Insufficient documentation

## 2018-03-13 DIAGNOSIS — Z171 Estrogen receptor negative status [ER-]: Secondary | ICD-10-CM | POA: Insufficient documentation

## 2018-03-13 DIAGNOSIS — T451X5S Adverse effect of antineoplastic and immunosuppressive drugs, sequela: Principal | ICD-10-CM

## 2018-03-13 LAB — COMPREHENSIVE METABOLIC PANEL
ALK PHOS: 77 U/L (ref 38–126)
ALT: 29 U/L (ref 0–44)
AST: 20 U/L (ref 15–41)
Albumin: 4.1 g/dL (ref 3.5–5.0)
Anion gap: 8 (ref 5–15)
BUN: 13 mg/dL (ref 8–23)
CALCIUM: 10.3 mg/dL (ref 8.9–10.3)
CHLORIDE: 100 mmol/L (ref 98–111)
CO2: 28 mmol/L (ref 22–32)
CREATININE: 0.76 mg/dL (ref 0.44–1.00)
GFR calc non Af Amer: >60 mL/min (ref >60)
Glucose, Bld: 106 mg/dL — ABNORMAL HIGH (ref 70–99)
Potassium: 4.1 mmol/L (ref 3.5–5.1)
Sodium: 136 mmol/L (ref 135–145)
Total Bilirubin: 0.8 mg/dL (ref 0.3–1.2)
Total Protein: 7 g/dL (ref 6.5–8.1)

## 2018-03-13 LAB — CBC WITH DIFFERENTIAL/PLATELET
Basophils Absolute: 0 10*3/uL (ref 0.0–0.1)
Basophils Relative: 1 %
EOS PCT: 1 %
Eosinophils Absolute: 0 10*3/uL (ref 0.0–0.5)
HCT: 34.9 % (ref 34.8–46.6)
Hemoglobin: 11.3 g/dL — ABNORMAL LOW (ref 11.6–15.9)
LYMPHS ABS: 0.4 10*3/uL — AB (ref 0.9–3.3)
LYMPHS PCT: 48 %
MCH: 28.5 pg (ref 26.0–34.0)
MCHC: 32.4 g/dL (ref 32.0–36.0)
MCV: 88.1 fL (ref 81.0–101.0)
MONO ABS: 0.2 10*3/uL (ref 0.1–0.9)
MONOS PCT: 17 %
Neutro Abs: 0.3 10*3/uL — CL (ref 1.5–6.5)
Neutrophils Relative %: 33 %
PLATELETS: 219 10*3/uL (ref 145–400)
RBC: 3.96 MIL/uL (ref 3.70–5.32)
RDW: 13.9 % (ref 11.1–15.7)
WBC: 0.9 10*3/uL — AB (ref 3.9–10.0)

## 2018-03-13 MED ORDER — TBO-FILGRASTIM 480 MCG/0.8ML ~~LOC~~ SOSY
480.0000 ug | PREFILLED_SYRINGE | Freq: Once | SUBCUTANEOUS | Status: AC
  Administered 2018-03-13: 480 ug via SUBCUTANEOUS
  Filled 2018-03-13: qty 0.8

## 2018-03-13 NOTE — Patient Instructions (Signed)
Tbo-Filgrastim injection What is this medicine? TBO-FILGRASTIM (T B O fil GRA stim) is a granulocyte colony-stimulating factor that stimulates the growth of neutrophils, a type of white blood cell important in the body's fight against infection. It is used to reduce the incidence of fever and infection in patients with certain types of cancer who are receiving chemotherapy that affects the bone marrow. This medicine may be used for other purposes; ask your health care provider or pharmacist if you have questions. COMMON BRAND NAME(S): Granix What should I tell my health care provider before I take this medicine? They need to know if you have any of these conditions: -bone scan or tests planned -kidney disease -sickle cell anemia -an unusual or allergic reaction to tbo-filgrastim, filgrastim, pegfilgrastim, other medicines, foods, dyes, or preservatives -pregnant or trying to get pregnant -breast-feeding How should I use this medicine? This medicine is for injection under the skin. If you get this medicine at home, you will be taught how to prepare and give this medicine. Refer to the Instructions for Use that come with your medication packaging. Use exactly as directed. Take your medicine at regular intervals. Do not take your medicine more often than directed. It is important that you put your used needles and syringes in a special sharps container. Do not put them in a trash can. If you do not have a sharps container, call your pharmacist or healthcare provider to get one. Talk to your pediatrician regarding the use of this medicine in children. Special care may be needed. Overdosage: If you think you have taken too much of this medicine contact a poison control center or emergency room at once. NOTE: This medicine is only for you. Do not share this medicine with others. What if I miss a dose? It is important not to miss your dose. Call your doctor or health care professional if you miss a  dose. What may interact with this medicine? This medicine may interact with the following medications: -medicines that may cause a release of neutrophils, such as lithium This list may not describe all possible interactions. Give your health care provider a list of all the medicines, herbs, non-prescription drugs, or dietary supplements you use. Also tell them if you smoke, drink alcohol, or use illegal drugs. Some items may interact with your medicine. What should I watch for while using this medicine? You may need blood work done while you are taking this medicine. What side effects may I notice from receiving this medicine? Side effects that you should report to your doctor or health care professional as soon as possible: -allergic reactions like skin rash, itching or hives, swelling of the face, lips, or tongue -blood in the urine -dark urine -dizziness -fast heartbeat -feeling faint -shortness of breath or breathing problems -signs and symptoms of infection like fever or chills; cough; or sore throat -signs and symptoms of kidney injury like trouble passing urine or change in the amount of urine -stomach or side pain, or pain at the shoulder -sweating -swelling of the legs, ankles, or abdomen -tiredness Side effects that usually do not require medical attention (report to your doctor or health care professional if they continue or are bothersome): -bone pain -headache -muscle pain -vomiting This list may not describe all possible side effects. Call your doctor for medical advice about side effects. You may report side effects to FDA at 1-800-FDA-1088. Where should I keep my medicine? Keep out of the reach of children. Store in a refrigerator between   2 and 8 degrees C (36 and 46 degrees F). Keep in carton to protect from light. Throw away this medicine if it is left out of the refrigerator for more than 5 consecutive days. Throw away any unused medicine after the expiration  date. NOTE: This sheet is a summary. It may not cover all possible information. If you have questions about this medicine, talk to your doctor, pharmacist, or health care provider.  2018 Elsevier/Gold Standard (2015-10-18 19:07:04)  

## 2018-03-15 ENCOUNTER — Inpatient Hospital Stay (HOSPITAL_BASED_OUTPATIENT_CLINIC_OR_DEPARTMENT_OTHER): Payer: Managed Care, Other (non HMO) | Admitting: Hematology

## 2018-03-15 ENCOUNTER — Inpatient Hospital Stay (HOSPITAL_COMMUNITY): Payer: Managed Care, Other (non HMO) | Attending: Hematology

## 2018-03-15 ENCOUNTER — Inpatient Hospital Stay (HOSPITAL_COMMUNITY): Payer: Managed Care, Other (non HMO)

## 2018-03-15 ENCOUNTER — Encounter (HOSPITAL_COMMUNITY): Payer: Self-pay | Admitting: Hematology

## 2018-03-15 VITALS — BP 113/70 | HR 73 | Temp 97.5°F | Resp 16

## 2018-03-15 VITALS — BP 135/70 | HR 89 | Temp 98.3°F | Resp 14 | Wt 182.6 lb

## 2018-03-15 DIAGNOSIS — R109 Unspecified abdominal pain: Secondary | ICD-10-CM | POA: Diagnosis not present

## 2018-03-15 DIAGNOSIS — R531 Weakness: Secondary | ICD-10-CM | POA: Insufficient documentation

## 2018-03-15 DIAGNOSIS — Z171 Estrogen receptor negative status [ER-]: Principal | ICD-10-CM

## 2018-03-15 DIAGNOSIS — Z85820 Personal history of malignant melanoma of skin: Secondary | ICD-10-CM | POA: Diagnosis not present

## 2018-03-15 DIAGNOSIS — R682 Dry mouth, unspecified: Secondary | ICD-10-CM | POA: Diagnosis not present

## 2018-03-15 DIAGNOSIS — Z7901 Long term (current) use of anticoagulants: Secondary | ICD-10-CM | POA: Insufficient documentation

## 2018-03-15 DIAGNOSIS — R21 Rash and other nonspecific skin eruption: Secondary | ICD-10-CM | POA: Insufficient documentation

## 2018-03-15 DIAGNOSIS — I10 Essential (primary) hypertension: Secondary | ICD-10-CM | POA: Diagnosis not present

## 2018-03-15 DIAGNOSIS — C50412 Malignant neoplasm of upper-outer quadrant of left female breast: Secondary | ICD-10-CM

## 2018-03-15 DIAGNOSIS — K5900 Constipation, unspecified: Secondary | ICD-10-CM | POA: Diagnosis not present

## 2018-03-15 DIAGNOSIS — Z5111 Encounter for antineoplastic chemotherapy: Secondary | ICD-10-CM | POA: Insufficient documentation

## 2018-03-15 DIAGNOSIS — Z79899 Other long term (current) drug therapy: Secondary | ICD-10-CM | POA: Insufficient documentation

## 2018-03-15 DIAGNOSIS — Z86718 Personal history of other venous thrombosis and embolism: Secondary | ICD-10-CM | POA: Diagnosis not present

## 2018-03-15 LAB — COMPREHENSIVE METABOLIC PANEL
ALT: 26 U/L (ref 0–44)
AST: 20 U/L (ref 15–41)
Albumin: 4 g/dL (ref 3.5–5.0)
Alkaline Phosphatase: 76 U/L (ref 38–126)
Anion gap: 8 (ref 5–15)
BUN: 13 mg/dL (ref 8–23)
CHLORIDE: 101 mmol/L (ref 98–111)
CO2: 29 mmol/L (ref 22–32)
CREATININE: 0.59 mg/dL (ref 0.44–1.00)
Calcium: 9.7 mg/dL (ref 8.9–10.3)
GFR calc Af Amer: >60 mL/min (ref >60)
Glucose, Bld: 116 mg/dL — ABNORMAL HIGH (ref 70–99)
Potassium: 3.4 mmol/L — ABNORMAL LOW (ref 3.5–5.1)
Sodium: 138 mmol/L (ref 135–145)
TOTAL PROTEIN: 6.9 g/dL (ref 6.5–8.1)
Total Bilirubin: 0.7 mg/dL (ref 0.3–1.2)

## 2018-03-15 LAB — DIFFERENTIAL
Basophils Absolute: 0 10*3/uL (ref 0.0–0.1)
Basophils Relative: 0 %
EOS ABS: 0.1 10*3/uL (ref 0.0–0.7)
Eosinophils Relative: 1 %
LYMPHS ABS: 0.7 10*3/uL (ref 0.7–4.0)
Lymphocytes Relative: 7 %
MONO ABS: 0.8 10*3/uL (ref 0.1–1.0)
Monocytes Relative: 8 %
NEUTROS ABS: 8.6 10*3/uL — AB (ref 1.7–7.7)
NEUTROS PCT: 84 %

## 2018-03-15 LAB — CBC
HEMATOCRIT: 36.4 % (ref 36.0–46.0)
HEMOGLOBIN: 11.5 g/dL — AB (ref 12.0–15.0)
MCH: 28.3 pg (ref 26.0–34.0)
MCHC: 31.6 g/dL (ref 30.0–36.0)
MCV: 89.7 fL (ref 78.0–100.0)
PLATELETS: 227 10*3/uL (ref 150–400)
RBC: 4.06 MIL/uL (ref 3.87–5.11)
RDW: 14.5 % (ref 11.5–15.5)
WBC: 10.2 10*3/uL (ref 4.0–10.5)

## 2018-03-15 MED ORDER — PALONOSETRON HCL INJECTION 0.25 MG/5ML
0.2500 mg | Freq: Once | INTRAVENOUS | Status: AC
  Administered 2018-03-15: 0.25 mg via INTRAVENOUS

## 2018-03-15 MED ORDER — SODIUM CHLORIDE 0.9 % IV SOLN
20.0000 mg | Freq: Once | INTRAVENOUS | Status: AC
  Administered 2018-03-15: 20 mg via INTRAVENOUS
  Filled 2018-03-15: qty 2

## 2018-03-15 MED ORDER — SODIUM CHLORIDE 0.9 % IV SOLN
150.0000 mg | Freq: Once | INTRAVENOUS | Status: AC
  Administered 2018-03-15: 150 mg via INTRAVENOUS
  Filled 2018-03-15: qty 15

## 2018-03-15 MED ORDER — SODIUM CHLORIDE 0.9 % IV SOLN
Freq: Once | INTRAVENOUS | Status: AC
  Administered 2018-03-15: 10:00:00 via INTRAVENOUS

## 2018-03-15 MED ORDER — PACLITAXEL CHEMO INJECTION 300 MG/50ML
80.0000 mg/m2 | Freq: Once | INTRAVENOUS | Status: AC
  Administered 2018-03-15: 150 mg via INTRAVENOUS
  Filled 2018-03-15: qty 25

## 2018-03-15 MED ORDER — DIPHENHYDRAMINE HCL 50 MG/ML IJ SOLN
INTRAMUSCULAR | Status: AC
  Filled 2018-03-15: qty 1

## 2018-03-15 MED ORDER — DIPHENHYDRAMINE HCL 50 MG/ML IJ SOLN
50.0000 mg | Freq: Once | INTRAMUSCULAR | Status: AC
  Administered 2018-03-15: 50 mg via INTRAVENOUS

## 2018-03-15 MED ORDER — FAMOTIDINE IN NACL 20-0.9 MG/50ML-% IV SOLN
20.0000 mg | Freq: Once | INTRAVENOUS | Status: AC
Start: 2018-03-15 — End: 2018-03-15
  Administered 2018-03-15: 20 mg via INTRAVENOUS

## 2018-03-15 MED ORDER — HEPARIN SOD (PORK) LOCK FLUSH 100 UNIT/ML IV SOLN
500.0000 [IU] | Freq: Once | INTRAVENOUS | Status: AC | PRN
  Administered 2018-03-15: 500 [IU]

## 2018-03-15 MED ORDER — PALONOSETRON HCL INJECTION 0.25 MG/5ML
INTRAVENOUS | Status: AC
  Filled 2018-03-15: qty 5

## 2018-03-15 MED ORDER — HEPARIN SOD (PORK) LOCK FLUSH 100 UNIT/ML IV SOLN
INTRAVENOUS | Status: AC
  Filled 2018-03-15: qty 5

## 2018-03-15 MED ORDER — FAMOTIDINE IN NACL 20-0.9 MG/50ML-% IV SOLN
INTRAVENOUS | Status: AC
  Filled 2018-03-15: qty 50

## 2018-03-15 NOTE — Progress Notes (Signed)
[place], [place] [address]   CLINIC:  Medical Oncology/Hematology  PCP:  Eber Hong, MD [id number] [address] [phone]   REASON FOR VISIT:  Follow-up for chemo for breat cancer  CURRENT THERAPY: Taxol and Carboplatin   BRIEF ONCOLOGIC HISTORY:    Malignant neoplasm of upper-outer quadrant of left breast in female, estrogen receptor negative ([place])   09/26/2017 Initial Biopsy    Biopsy of the left breast and left axillary lymph node consistent with high-grade TNBC, Ki-67 of 80%  Clinical stage T1cN2 M0      09/26/2017 Family History    Sister had breast cancer at age 10, HER-2 positive, multiple paternal uncles had prostate cancer, father had prostate cancer, 2 paternal cousins had breast cancer       10/11/2017 Echocardiogram    Echocardiogram on 10/11/2017 showed ejection fraction of 65-70%      10/18/2017 Imaging    CT scan of the chest, abdomen and pelvis shows tiny circumscribed noncalcified nodule in the posterior segment of the left upper lobe of the lung with no other evidence of metastatic disease  MRI of the abdomen with and without contrast ordered for elevated total bilirubin on 10/29/2017 shows no evidence of liver metastasis      10/22/2017 Genetic Testing    [employer] myRisk test for BRCA 1 and 2 and other mutations negative      10/23/2017 -  Chemotherapy    The patient had DOXOrubicin (ADRIAMYCIN) chemo injection 114 mg, 60 mg/m2, Intravenous,  Once, 4 of 4 cycles Administration: 114 mg (12/05/2017) palonosetron (ALOXI) injection 0.25 mg, 0.25 mg, Intravenous,  Once, 8 of 8 cycles Administration: 0.25 mg (12/20/2017), 0.25 mg (12/05/2017), 0.25 mg (01/17/2018), 0.25 mg (12/27/2017), 0.25 mg (01/10/2018), 0.25 mg (01/25/2018), 0.25 mg (02/15/2018), 0.25 mg (02/22/2018), 0.25 mg (03/01/2018), 0.25 mg (02/08/2018), 0.25 mg (03/08/2018) pegfilgrastim (NEULASTA ONPRO KIT) injection 6 mg, 6 mg, Subcutaneous, Once, 1 of  1 cycle Administration: 6 mg (12/05/2017) filgrastim (NEUPOGEN) injection 480 mcg, 480 mcg, Subcutaneous, Once PRN, 1 of 1 cycle pegfilgrastim-cbqv (UDENYCA) injection 6 mg, 6 mg, Subcutaneous, Once, 3 of 3 cycles CARBOplatin (PARAPLATIN) 230 mg in sodium chloride 0.9 % 250 mL chemo infusion, 230 mg (100 % of original dose 230.8 mg), Intravenous,  Once, 4 of 4 cycles Dose modification:   (original dose 230.8 mg, Cycle 5), 100 mg (original dose 100 mg, Cycle 6),   (original dose 230.8 mg, Cycle 5), 184.64 mg (80 % of original dose 230.8 mg, Cycle 5, Reason: Provider Judgment), 150 mg (original dose 100 mg, Cycle 7, Reason: Provider Judgment),   (original dose 100 mg, Cycle 7, Reason: Provider Judgment), 150 mg (original dose 100 mg, Cycle 7, Reason: Provider Judgment), 150 mg (original dose 100 mg, Cycle 8, Reason: Provider Judgment), 150 mg (original dose 100 mg, Cycle 6, Reason: Provider Judgment) Administration: 230 mg (12/20/2017), 100 mg (01/17/2018), 180 mg (12/27/2017), 150 mg (02/15/2018), 200 mg (02/22/2018), 150 mg (03/01/2018), 150 mg (02/08/2018), 100 mg (03/08/2018) cyclophosphamide (CYTOXAN) 1,140 mg in sodium chloride 0.9 % 250 mL chemo infusion, 600 mg/m2, Intravenous,  Once, 4 of 4 cycles Administration: 1,140 mg (12/05/2017) PACLitaxel (TAXOL) 150 mg in dextrose 5 % 250 mL chemo infusion (</= 66m/m2), 80 mg/m2 = 150 mg, Intravenous,  Once, 4 of 4 cycles Administration: 150 mg (12/20/2017), 150 mg (12/27/2017), 150 mg (01/17/2018), 150 mg (01/10/2018), 150 mg (01/25/2018), 150 mg (02/15/2018), 150 mg (02/22/2018), 150 mg (03/01/2018), 150 mg (  02/08/2018), 150 mg (03/08/2018) fosaprepitant (EMEND) 150 mg, dexamethasone (DECADRON) 12 mg in sodium chloride 0.9 % 145 mL IVPB, , Intravenous,  Once, 5 of 5 cycles Administration:  (12/20/2017),  (12/05/2017)  for chemotherapy treatment.       10/24/2017 -  Neo-Adjuvant Chemotherapy    Dose dense AC for 4 cycles followed by weekly carboplatin and paclitaxel for 12  weeks        CANCER STAGING: Cancer Staging Malignant neoplasm of upper-outer quadrant of left breast in female, estrogen receptor negative ([place]) Staging form: Breast, AJCC 8th Edition - Clinical: Stage IIIC (cT1, cN2, cM0, G3, ER: Negative, PR: Negative, HER2: Negative) - Signed by Holley Bouche, NP on 11/28/2017    INTERVAL HISTORY:  Ms. Wollam 67 y.o. female returns for routine follow-up for breast cancer and her last cycle of chemotherapy. Patient has been having constipation which is getting better with Miralax. She is complains of a right hand rash. It is only a small rash and itches. Itching can be controlled with lotion. Denies nausea,vomiting, and diarrhea. Denies and fever or infections.  Overall, she tells me she has been feeling pretty well. Energy levels 100%; appetite 100%. Overall, she feels ready for next cycle of chemo today.     REVIEW OF SYSTEMS:  Review of Systems  Gastrointestinal: Positive for constipation.  Skin: Positive for itching and rash (right hand).  Hematological: Bruises/bleeds easily.  All other systems reviewed and are negative.    PAST MEDICAL/SURGICAL HISTORY:  Past Medical History:  Diagnosis Date  . Breast cancer ([place])   . H/O seasonal allergies   . Hypertension   . Thrombosis    in right arm developed after 2nd round of chemotherapy   Past Surgical History:  Procedure Laterality Date  . BUNIONECTOMY Bilateral   . CYSTECTOMY     removed from left breast  . MELANOMA EXCISION Left    posterior forearm  . PORTA CATH INSERTION    . TUBAL LIGATION       SOCIAL HISTORY:  Social History   Socioeconomic History  . Marital status: Divorced    Spouse name: Not on file  . Number of children: Not on file  . Years of education: Not on file  . Highest education level: Not on file  Occupational History  . Not on file  Social Needs  . Financial resource strain: Not on file  . Food insecurity:    Worry: Not on file    Inability:  Not on file  . Transportation needs:    Medical: Not on file    Non-medical: Not on file  Tobacco Use  . Smoking status: Never Smoker  . Smokeless tobacco: Never Used  Substance and Sexual Activity  . Alcohol use: Never    Frequency: Never  . Drug use: Never  . Sexual activity: Not Currently  Lifestyle  . Physical activity:    Days per week: Not on file    Minutes per session: Not on file  . Stress: Not on file  Relationships  . Social connections:    Talks on phone: Not on file    Gets together: Not on file    Attends religious service: Not on file    Active member of club or organization: Not on file    Attends meetings of clubs or organizations: Not on file    Relationship status: Not on file  . Intimate partner violence:    Fear of current or ex partner: Not on file  Emotionally abused: Not on file    Physically abused: Not on file    Forced sexual activity: Not on file  Other Topics Concern  . Not on file  Social History Narrative  . Not on file    FAMILY HISTORY:  History reviewed. No pertinent family history.  CURRENT MEDICATIONS:  Outpatient Encounter Medications as of 03/15/2018  Medication Sig  . amLODipine (NORVASC) 5 MG tablet Take 1 tablet by mouth daily.  Marland Kitchen apixaban (ELIQUIS) 2.5 MG TABS tablet Take 1 tablet (2.5 mg total) by mouth 2 (two) times daily.  Marland Kitchen atorvastatin (LIPITOR) 10 MG tablet Take 1 tablet by mouth daily.  Marland Kitchen CALCIUM PO Take 2 tablets by mouth daily.  Marland Kitchen CALCIUM PO Take by mouth.  . fexofenadine (ALLEGRA) 60 MG tablet Take 1 tablet by mouth daily as needed.  . hydrochlorothiazide (HYDRODIURIL) 25 MG tablet Take 1 tablet by mouth daily.  Marland Kitchen ibuprofen (ADVIL,MOTRIN) 200 MG tablet Take 2 tablets by mouth 3 (three) times daily as needed.  Marland Kitchen KLOR-CON M20 20 MEQ tablet Take 20 mEq by mouth daily.  . ondansetron (ZOFRAN) 4 MG tablet Take 4 mg by mouth 3 (three) times daily as needed.  Marland Kitchen oxyCODONE-acetaminophen (PERCOCET/ROXICET) 5-325 MG tablet  Take 1-2 tablets by mouth every 4 (four) hours as needed. for pain  . prochlorperazine (COMPAZINE) 10 MG tablet Take 10 mg by mouth every 6 (six) hours as needed.  . ramipril (ALTACE) 5 MG capsule Take 1 capsule by mouth daily.  . Vitamin D, Ergocalciferol, (DRISDOL) 50000 units CAPS capsule Take 1 capsule by mouth once a week. On Mondays   No facility-administered encounter medications on file as of 03/15/2018.     ALLERGIES:  Allergies  Allergen Reactions  . Penicillin G      PHYSICAL EXAM:  ECOG Performance status: 1  Vitals:   03/15/18 0835  BP: 135/70  Pulse: 89  Resp: 14  Temp: 98.3 F (36.8 C)  SpO2: 100%   Filed Weights   03/15/18 0835  Weight: 182 lb 9.6 oz (82.8 kg)    Physical Exam Breast examination today did not reveal any clear or breast masses.  However there is left upper outer quadrant mass measuring 2.5 x 2.5 cm and freely mobile.  This has decreased in size from prior to start of therapy.  LABORATORY DATA:  I have reviewed the labs as listed.  CBC    Component Value Date/Time   WBC 10.2 03/15/2018 0822   RBC 4.06 03/15/2018 0822   HGB 11.5 (L) 03/15/2018 0822   HGB 12.0 02/06/2018 1500   HCT 36.4 03/15/2018 0822   PLT 227 03/15/2018 0822   PLT 257 02/06/2018 1500   MCV 89.7 03/15/2018 0822   MCH 28.3 03/15/2018 0822   MCHC 31.6 03/15/2018 0822   RDW 14.5 03/15/2018 0822   LYMPHSABS 0.7 03/15/2018 0822   MONOABS 0.8 03/15/2018 0822   EOSABS 0.1 03/15/2018 0822   BASOSABS 0.0 03/15/2018 0822   CMP Latest Ref Rng & Units 03/15/2018 03/13/2018 03/08/2018  Glucose 70 - 99 mg/dL 116(H) 106(H) 105(H)  BUN 8 - 23 mg/dL _0 Creatinine 0.44 - 1.00 mg/dL 0.59 0.76 0.62  Sodium 135 - 145 mmol/L 138 136 139  Potassium 3.5 - 5.1 mmol/L 3.4(L) 4.1 3.9  Chloride 98 - 111 mmol/L 101 100 103  CO2 22 - 32 mmol/L _1 Calcium 8.9 - 10.3 mg/dL 9.7 10.3 10.0  Total Protein 6.5 - 8.1 g/dL 6.9  7.0 7.3  Total Bilirubin 0.3 - 1.2 mg/dL 0.7 0.8 0.7    Alkaline Phos 38 - 126 U/L 76 77 84  AST 15 - 41 U/L _0 ALT 0 - 44 U/L _1 ASSESSMENT & PLAN:   Malignant neoplasm of upper-outer quadrant of left breast in female, estrogen receptor negative ([place]) 1.  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  She is currently receiving weekly paclitaxel with carboplatin. -She is tolerating weekly Taxol with carboplatin with the help of G-CSF support.  She goes to Dr. Antonieta Pert office in [facility] 2 days prior to each cycle to receive G-CSF.  She will proceed with week 11 of carboplatin and paclitaxel.  I have reviewed her blood counts which were adequate. - Today's examination reveals 2.5 x 2.5 cm left anterior axillary mass.  No clear breast masses felt. -I will make a referral to Dr. Donne Hazel for surgery.  She will finish her last weekly paclitaxel next week.  I will follow her after surgery.  2.  Genetic testing: She had extensive family history of malignancies.  We have done genetic testing for commonly inheritable genetic mutations, it was reported negative.   3.  Right upper extremity DVT: -She was diagnosed with right upper extremity DVT when she had port placed.  This happened end of February.  She was started on Eliquis on November 09, 2017.  She is currently taking 5 mg twice a day.  She has finished 4 months of treatment.  As she was having lots of bruising, her Eliquis dose was reduced to 2.5 mg twice daily.  We plan to discontinue it after the port comes out.      Orders placed this encounter:  Orders Placed This Encounter  Procedures  . CBC with Differential/Platelet  . Comprehensive metabolic panel      Derek Jack, MD [place] [phone]

## 2018-03-15 NOTE — Assessment & Plan Note (Signed)
1.  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  She is currently receiving weekly paclitaxel with carboplatin. -She is tolerating weekly Taxol with carboplatin with the help of G-CSF support.  She goes to Dr. Antonieta Pert office in [facility] 2 days prior to each cycle to receive G-CSF.  She will proceed with week 11 of carboplatin and paclitaxel.  I have reviewed her blood counts which were adequate. - Today's examination reveals 2.5 x 2.5 cm left anterior axillary mass.  No clear breast masses felt. -I will make a referral to Dr. Donne Hazel for surgery.  She will finish her last weekly paclitaxel next week.  I will follow her after surgery.  2.  Genetic testing: She had extensive family history of malignancies.  We have done genetic testing for commonly inheritable genetic mutations, it was reported negative.   3.  Right upper extremity DVT: -She was diagnosed with right upper extremity DVT when she had port placed.  This happened end of February.  She was started on Eliquis on November 09, 2017.  She is currently taking 5 mg twice a day.  She has finished 4 months of treatment.  As she was having lots of bruising, her Eliquis dose was reduced to 2.5 mg twice daily.  We plan to discontinue it after the port comes out.

## 2018-03-15 NOTE — Patient Instructions (Signed)
[facility] Discharge Instructions  You saw Dr. Katragadda today.   Thank you for choosing [facility] at [facility] to provide your oncology and hematology care.  To afford each patient quality time with our provider, please arrive at least 15 minutes before your scheduled appointment time.   If you have a lab appointment with the Cancer Center please come in thru the  Main Entrance and check in at the main information desk  You need to re-schedule your appointment should you arrive 10 or more minutes late.  We strive to give you quality time with our providers, and arriving late affects you and other patients whose appointments are after yours.  Also, if you no show three or more times for appointments you may be dismissed from the clinic at the providers discretion.     Again, thank you for choosing [facility].  Our hope is that these requests will decrease the amount of time that you wait before being seen by our physicians.       _____________________________________________________________  Should you have questions after your visit to [facility], please contact our office at [phone] between the hours of 8:30 a.m. and 4:30 p.m.  Voicemails left after 4:30 p.m. will not be returned until the following business day.  For prescription refill requests, have your pharmacy contact our office.       Resources For Cancer Patients and their Caregivers ? American Cancer Society: Can assist with transportation, wigs, general needs, runs Look Good Feel Better.        1-888-227-6333 ? Cancer Care: Provides financial assistance, online support groups, medication/co-pay assistance.  1-800-813-HOPE (4673) ? [facility] Assists [place] cancer patients and their families through emotional , educational and financial support.  336-427-4357 ? [place] DSS Where to apply for  food stamps, Medicaid and utility assistance. 336-342-1394 ? [facility]: Transportation to medical appointments. 336-347-2287 ? Social Security Administration: May apply for disability if have a Stage IV cancer. 336-342-7796 1-800-772-1213 ? [place] Aging, Disability and Transit Services: Assists with nutrition, care and transit needs. 336-349-2343  Cancer Center Support Programs:   > Cancer Support Group  2nd Tuesday of the month 1pm-2pm, Journey Room   > Creative Journey  3rd Tuesday of the month 1130am-1pm, Journey Room     

## 2018-03-15 NOTE — Progress Notes (Signed)
Tolerated infusions w/o adverse reaction.  Alert, in no distress.  VSS.  Discharged ambulatory in c/o family.  

## 2018-03-18 ENCOUNTER — Encounter (HOSPITAL_COMMUNITY): Payer: Self-pay | Admitting: Hematology

## 2018-03-18 NOTE — Progress Notes (Signed)
Faxed referral and patient records to Dr. Donne Hazel at [facility] [phone].

## 2018-03-20 ENCOUNTER — Other Ambulatory Visit: Payer: Self-pay

## 2018-03-20 ENCOUNTER — Inpatient Hospital Stay: Payer: Managed Care, Other (non HMO)

## 2018-03-20 ENCOUNTER — Telehealth: Payer: Self-pay

## 2018-03-20 ENCOUNTER — Other Ambulatory Visit (HOSPITAL_COMMUNITY): Payer: Self-pay

## 2018-03-20 VITALS — BP 148/70 | HR 89 | Temp 98.3°F

## 2018-03-20 DIAGNOSIS — C50411 Malignant neoplasm of upper-outer quadrant of right female breast: Secondary | ICD-10-CM

## 2018-03-20 DIAGNOSIS — C50412 Malignant neoplasm of upper-outer quadrant of left female breast: Secondary | ICD-10-CM

## 2018-03-20 DIAGNOSIS — D701 Agranulocytosis secondary to cancer chemotherapy: Secondary | ICD-10-CM

## 2018-03-20 DIAGNOSIS — D702 Other drug-induced agranulocytosis: Principal | ICD-10-CM

## 2018-03-20 DIAGNOSIS — Z171 Estrogen receptor negative status [ER-]: Principal | ICD-10-CM

## 2018-03-20 DIAGNOSIS — T451X5S Adverse effect of antineoplastic and immunosuppressive drugs, sequela: Principal | ICD-10-CM

## 2018-03-20 LAB — CBC WITH DIFFERENTIAL (CANCER CENTER ONLY)
Basophils Absolute: 0 10*3/uL (ref 0.0–0.1)
Basophils Relative: 1 %
EOS ABS: 0 10*3/uL (ref 0.0–0.5)
EOS PCT: 1 %
HCT: 35.9 % (ref 34.8–46.6)
HEMOGLOBIN: 11.6 g/dL (ref 11.6–15.9)
LYMPHS ABS: 0.5 10*3/uL — AB (ref 0.9–3.3)
LYMPHS PCT: 42 %
MCH: 28.5 pg (ref 26.0–34.0)
MCHC: 32.3 g/dL (ref 32.0–36.0)
MCV: 88.2 fL (ref 81.0–101.0)
MONOS PCT: 15 %
Monocytes Absolute: 0.2 10*3/uL (ref 0.1–0.9)
Neutro Abs: 0.5 10*3/uL — ABNORMAL LOW (ref 1.5–6.5)
Neutrophils Relative %: 41 %
Platelet Count: 202 10*3/uL (ref 145–400)
RBC: 4.07 MIL/uL (ref 3.70–5.32)
RDW: 13.9 % (ref 11.1–15.7)
WBC Count: 1.1 10*3/uL — ABNORMAL LOW (ref 3.9–10.0)

## 2018-03-20 LAB — CMP (CANCER CENTER ONLY)
ALT: 30 U/L (ref 0–44)
ANION GAP: 8 (ref 5–15)
AST: 21 U/L (ref 15–41)
Albumin: 4.1 g/dL (ref 3.5–5.0)
Alkaline Phosphatase: 78 U/L (ref 38–126)
BUN: 13 mg/dL (ref 8–23)
CALCIUM: 10.2 mg/dL (ref 8.9–10.3)
CO2: 29 mmol/L (ref 22–32)
Chloride: 100 mmol/L (ref 98–111)
Creatinine: 0.76 mg/dL (ref 0.44–1.00)
Glucose, Bld: 105 mg/dL — ABNORMAL HIGH (ref 70–99)
Potassium: 4.1 mmol/L (ref 3.5–5.1)
SODIUM: 137 mmol/L (ref 135–145)
TOTAL PROTEIN: 7.2 g/dL (ref 6.5–8.1)
Total Bilirubin: 0.9 mg/dL (ref 0.3–1.2)

## 2018-03-20 MED ORDER — TBO-FILGRASTIM 480 MCG/0.8ML ~~LOC~~ SOSY
480.0000 ug | PREFILLED_SYRINGE | Freq: Once | SUBCUTANEOUS | Status: AC
  Administered 2018-03-20: 480 ug via SUBCUTANEOUS
  Filled 2018-03-20: qty 0.8

## 2018-03-20 MED ORDER — FILGRASTIM 480 MCG/0.8ML IJ SOSY: 480.0000 ug | PREFILLED_SYRINGE | Freq: Once | INTRAMUSCULAR | Status: DC

## 2018-03-20 NOTE — Patient Instructions (Signed)
Filgrastim, G-CSF injection  What is this medicine?  FILGRASTIM, G-CSF (fil GRA stim) is a granulocyte colony-stimulating factor that stimulates the growth of neutrophils, a type of white blood cell (WBC) important in the body's fight against infection. It is used to reduce the incidence of fever and infection in patients with certain types of cancer who are receiving chemotherapy that affects the bone marrow, to stimulate blood cell production for removal of WBCs from the body prior to a bone marrow transplantation, to reduce the incidence of fever and infection in patients who have severe chronic neutropenia, and to improve survival outcomes following high-dose radiation exposure that is toxic to the bone marrow.  This medicine may be used for other purposes; ask your health care provider or pharmacist if you have questions.  COMMON BRAND NAME(S): Neupogen, Zarxio  What should I tell my health care provider before I take this medicine?  They need to know if you have any of these conditions:  -kidney disease  -latex allergy  -ongoing radiation therapy  -sickle cell disease  -an unusual or allergic reaction to filgrastim, pegfilgrastim, other medicines, foods, dyes, or preservatives  -pregnant or trying to get pregnant  -breast-feeding  How should I use this medicine?  This medicine is for injection under the skin or infusion into a vein. As an infusion into a vein, it is usually given by a health care professional in a hospital or clinic setting. If you get this medicine at home, you will be taught how to prepare and give this medicine. Refer to the Instructions for Use that come with your medication packaging. Use exactly as directed. Take your medicine at regular intervals. Do not take your medicine more often than directed.  It is important that you put your used needles and syringes in a special sharps container. Do not put them in a trash can. If you do not have a sharps container, call your pharmacist or  healthcare provider to get one.  Talk to your pediatrician regarding the use of this medicine in children. While this drug may be prescribed for children as young as 7 months for selected conditions, precautions do apply.  Overdosage: If you think you have taken too much of this medicine contact a poison control center or emergency room at once.  NOTE: This medicine is only for you. Do not share this medicine with others.  What if I miss a dose?  It is important not to miss your dose. Call your doctor or health care professional if you miss a dose.  What may interact with this medicine?  This medicine may interact with the following medications:  -medicines that may cause a release of neutrophils, such as lithium  This list may not describe all possible interactions. Give your health care provider a list of all the medicines, herbs, non-prescription drugs, or dietary supplements you use. Also tell them if you smoke, drink alcohol, or use illegal drugs. Some items may interact with your medicine.  What should I watch for while using this medicine?  You may need blood work done while you are taking this medicine.  What side effects may I notice from receiving this medicine?  Side effects that you should report to your doctor or health care professional as soon as possible:  -allergic reactions like skin rash, itching or hives, swelling of the face, lips, or tongue  -dizziness or feeling faint  -fever  -pain, redness, or irritation at site where injected  -pinpoint red spots   on the skin  -shortness of breath or breathing problems  -signs and symptoms of kidney injury like trouble passing urine, change in the amount of urine, or red or dark-brown urine  -stomach or side pain, or pain at the shoulder  -swelling  -tiredness  -  unusual bleeding or bruising  Side effects that usually do not require medical attention (report to your doctor or health care professional if they continue or are bothersome):  -bone  pain  -headache  -muscle pain  This list may not describe all possible side effects. Call your doctor for medical advice about side effects. You may report side effects to FDA at 1-800-FDA-1088.  Where should I keep my medicine?  Keep out of the reach of children.  Store in a refrigerator between 2 and 8 degrees C (36 and 46 degrees F). Do not freeze. Keep in carton to protect from light. Throw away this medicine if vials or syringes are left out of the refrigerator for more than 24 hours. Throw away any unused medicine after the expiration date.  NOTE: This sheet is a summary. It may not cover all possible information. If you have questions about this medicine, talk to your doctor, pharmacist, or health care provider.  © 2018 Elsevier/Gold Standard (2015-03-17 10:57:13)

## 2018-03-20 NOTE — Telephone Encounter (Signed)
Dr Delton Coombes aware of critical low ANC and WBC of 1.1. Proceed with neupogen as ordered per VOV MD. dph

## 2018-03-22 ENCOUNTER — Emergency Department (HOSPITAL_BASED_OUTPATIENT_CLINIC_OR_DEPARTMENT_OTHER)
Admission: EM | Admit: 2018-03-22 | Discharge: 2018-03-22 | Disposition: A | Discharge status: Home / Self Care | Payer: Managed Care, Other (non HMO) | Attending: Emergency Medicine | Admitting: Emergency Medicine

## 2018-03-22 ENCOUNTER — Inpatient Hospital Stay (HOSPITAL_COMMUNITY): Payer: Managed Care, Other (non HMO)

## 2018-03-22 ENCOUNTER — Encounter (HOSPITAL_COMMUNITY): Payer: Self-pay

## 2018-03-22 ENCOUNTER — Other Ambulatory Visit: Payer: Self-pay

## 2018-03-22 ENCOUNTER — Telehealth (HOSPITAL_COMMUNITY): Payer: Self-pay

## 2018-03-22 ENCOUNTER — Encounter (HOSPITAL_BASED_OUTPATIENT_CLINIC_OR_DEPARTMENT_OTHER): Payer: Self-pay | Admitting: *Deleted

## 2018-03-22 VITALS — BP 130/69 | HR 95 | Temp 98.3°F | Resp 18 | Wt 183.0 lb

## 2018-03-22 DIAGNOSIS — Z171 Estrogen receptor negative status [ER-]: Secondary | ICD-10-CM | POA: Insufficient documentation

## 2018-03-22 DIAGNOSIS — C50412 Malignant neoplasm of upper-outer quadrant of left female breast: Secondary | ICD-10-CM

## 2018-03-22 DIAGNOSIS — R21 Rash and other nonspecific skin eruption: Secondary | ICD-10-CM | POA: Diagnosis present

## 2018-03-22 DIAGNOSIS — Z79899 Other long term (current) drug therapy: Secondary | ICD-10-CM | POA: Diagnosis not present

## 2018-03-22 DIAGNOSIS — I10 Essential (primary) hypertension: Secondary | ICD-10-CM | POA: Diagnosis not present

## 2018-03-22 DIAGNOSIS — Z7901 Long term (current) use of anticoagulants: Secondary | ICD-10-CM | POA: Insufficient documentation

## 2018-03-22 DIAGNOSIS — T7840XA Allergy, unspecified, initial encounter: Secondary | ICD-10-CM | POA: Diagnosis not present

## 2018-03-22 DIAGNOSIS — C50411 Malignant neoplasm of upper-outer quadrant of right female breast: Secondary | ICD-10-CM

## 2018-03-22 LAB — COMPREHENSIVE METABOLIC PANEL
ALBUMIN: 4.1 g/dL (ref 3.5–5.0)
ALT: 26 U/L (ref 0–44)
AST: 18 U/L (ref 15–41)
Alkaline Phosphatase: 77 U/L (ref 38–126)
Anion gap: 9 (ref 5–15)
BILIRUBIN TOTAL: 0.7 mg/dL (ref 0.3–1.2)
BUN: 13 mg/dL (ref 8–23)
CHLORIDE: 102 mmol/L (ref 98–111)
CO2: 26 mmol/L (ref 22–32)
CREATININE: 0.61 mg/dL (ref 0.44–1.00)
Calcium: 9.7 mg/dL (ref 8.9–10.3)
GFR calc Af Amer: >60 mL/min (ref >60)
GLUCOSE: 98 mg/dL (ref 70–99)
Potassium: 3.4 mmol/L — ABNORMAL LOW (ref 3.5–5.1)
Sodium: 137 mmol/L (ref 135–145)
Total Protein: 7.2 g/dL (ref 6.5–8.1)

## 2018-03-22 LAB — CBC WITH DIFFERENTIAL/PLATELET
BASOS ABS: 0 10*3/uL (ref 0.0–0.1)
Basophils Relative: 0 %
EOS PCT: 1 %
Eosinophils Absolute: 0.1 10*3/uL (ref 0.0–0.7)
HEMATOCRIT: 36.1 % (ref 36.0–46.0)
Hemoglobin: 11.5 g/dL — ABNORMAL LOW (ref 12.0–15.0)
Lymphocytes Relative: 9 %
Lymphs Abs: 0.8 10*3/uL (ref 0.7–4.0)
MCH: 28.5 pg (ref 26.0–34.0)
MCHC: 31.9 g/dL (ref 30.0–36.0)
MCV: 89.4 fL (ref 78.0–100.0)
MONOS PCT: 8 %
Monocytes Absolute: 0.7 10*3/uL (ref 0.1–1.0)
NEUTROS PCT: 82 %
Neutro Abs: 6.8 10*3/uL (ref 1.7–7.7)
Platelets: 209 10*3/uL (ref 150–400)
RBC: 4.04 MIL/uL (ref 3.87–5.11)
RDW: 14.6 % (ref 11.5–15.5)
WBC: 8.4 10*3/uL (ref 4.0–10.5)

## 2018-03-22 MED ORDER — FAMOTIDINE IN NACL 20-0.9 MG/50ML-% IV SOLN
20.0000 mg | Freq: Once | INTRAVENOUS | Status: AC
Start: 2018-03-22 — End: 2018-03-22
  Administered 2018-03-22: 20 mg via INTRAVENOUS
  Filled 2018-03-22: qty 50

## 2018-03-22 MED ORDER — PREDNISONE 20 MG PO TABS: 40.0000 mg | ORAL_TABLET | Freq: Every day | ORAL | 0 refills | Status: AC

## 2018-03-22 MED ORDER — SODIUM CHLORIDE 0.9 % IV SOLN
20.0000 mg | Freq: Once | INTRAVENOUS | Status: AC
  Administered 2018-03-22: 20 mg via INTRAVENOUS
  Filled 2018-03-22: qty 2

## 2018-03-22 MED ORDER — HEPARIN SOD (PORK) LOCK FLUSH 100 UNIT/ML IV SOLN
500.0000 [IU] | Freq: Once | INTRAVENOUS | Status: AC | PRN
  Administered 2018-03-22: 500 [IU]
  Filled 2018-03-22: qty 5

## 2018-03-22 MED ORDER — DIPHENHYDRAMINE HCL 50 MG/ML IJ SOLN
50.0000 mg | Freq: Once | INTRAMUSCULAR | Status: AC
  Administered 2018-03-22: 50 mg via INTRAVENOUS
  Filled 2018-03-22: qty 1

## 2018-03-22 MED ORDER — SODIUM CHLORIDE 0.9 % IV SOLN
Freq: Once | INTRAVENOUS | Status: AC
  Administered 2018-03-22: 12:00:00 via INTRAVENOUS

## 2018-03-22 MED ORDER — SODIUM CHLORIDE 0.9 % IV SOLN
80.0000 mg/m2 | Freq: Once | INTRAVENOUS | Status: AC
  Administered 2018-03-22: 150 mg via INTRAVENOUS
  Filled 2018-03-22: qty 25

## 2018-03-22 MED ORDER — PALONOSETRON HCL INJECTION 0.25 MG/5ML
0.2500 mg | Freq: Once | INTRAVENOUS | Status: AC
  Administered 2018-03-22: 0.25 mg via INTRAVENOUS
  Filled 2018-03-22: qty 5

## 2018-03-22 MED ORDER — SODIUM CHLORIDE 0.9 % IV SOLN
203.8000 mg | Freq: Once | INTRAVENOUS | Status: AC
  Administered 2018-03-22: 200 mg via INTRAVENOUS
  Filled 2018-03-22: qty 20

## 2018-03-22 MED FILL — predniSONE 20 MG TABS: 20 | 4 days supply | Qty: 8 | Fill #0

## 2018-03-22 NOTE — Telephone Encounter (Signed)
Patient called stating she developed a rash and itching on the way home. Notified patients treatment room RN , Ebony Hail and Dr. Delton Coombes who both talked with patient. Patient stated she had "felt funny" after treatment and before leaving the cancer center but did not say anything. She described the funny feeling as dry mouth and feeling warm all over. Patient states she is in the parking lot of a [facility] on [address]. Ebony Hail, RN instructed patient to go into the ER or urgent care and be checked out. Patient verbalized understanding.

## 2018-03-22 NOTE — Progress Notes (Signed)
Tolerated infusions w/o adverse reaction.  Alert, in no distress.  VSS.  Discharged ambulatory in c/o family.  

## 2018-03-22 NOTE — Discharge Instructions (Addendum)
Take prednisone daily starting tomorrow for the next 4 days.  You can take Benadryl as needed for itching.  Benadryl can make you drowsy so please do not drive or work while taking it.  Return to the ER if you have any new or concerning symptoms like trouble breathing, face swelling, throat closing, trouble swallowing.

## 2018-03-22 NOTE — ED Provider Notes (Signed)
[place] EMERGENCY DEPARTMENT Provider Note   CSN: [id number] Arrival date & time: 03/22/18  1609     History   Chief Complaint Chief Complaint  Patient presents with  . Rash    HPI Brenda Richmond is a 67 y.o. female.  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 Patient states that today was her last chemotherapy infusion, has been on chemotherapy since February and has never had a reaction. States that today she feels like the infusion was going at a faster rate than usual.  She states that shortly after finishing, she felt itching on her bilateral arms and noted a fine red rash on both arms.  Felt as if her mouth is dry.  She was given IV Benadryl and steroid and reports that the rash has improved and she no longer complains of pruritus.  She denies shortness of breath, wheezing, throat closing, facial swelling, chest pain, rash elsewhere, fever, chills, trouble swallowing.   Past Medical History:  Diagnosis Date  . Breast cancer ([place])   . H/O seasonal allergies   . Hypertension   . Thrombosis    in right arm developed after 2nd round of chemotherapy    Patient Active Problem List   Diagnosis Date Noted  . Neutropenia due to and not concurrent with chemotherapy ([place]) 02/08/2018  . Hypercholesterolemia 12/05/2017  . Hypertension 12/05/2017  . Malignant neoplasm of upper-outer quadrant of left breast in female, estrogen receptor negative ([place]) 11/28/2017  . Positive ANA (antinuclear antibody) 11/25/2017  . Malignant neoplasm of upper-outer quadrant of female breast ([place]) 09/28/2017  . Hyperglycemia 01/25/2016  . Anemia, deficiency 02/08/2015  . Prediabetes 08/15/2013  . Adenomatous polyp of ascending colon 07/29/2013  . Hemorrhoids, internal 07/29/2013  . Encounter for long-term  (current) use of medications 09/03/2012  . Family history of breast cancer 09/08/2009  . Status post tubal ligation 09/07/2009  . Allergy, food 06/30/2009  . Esophageal reflux 09/07/2008  . Vitamin D deficiency 09/07/2008  . Fibrocystic breast disease 06/11/2005  . Constipation 02/21/2005  . Closed fracture of thoracic vertebra ([place]) 02/16/2004  . History of disease of skin and subcutaneous tissue 05/27/2003  . Mitral valve disorder 04/20/2003  . Tricuspid regurgitation 04/20/2003  . Malignant melanoma of skin of left upper extremity, including shoulder ([place]) 03/13/2003  . Allergic rhinitis 12/26/2000    Past Surgical History:  Procedure Laterality Date  . BUNIONECTOMY Bilateral   . CYSTECTOMY     removed from left breast  . MELANOMA EXCISION Left    posterior forearm  . PORTA CATH INSERTION    . TUBAL LIGATION       OB History   None      Home Medications    Prior to Admission medications   Medication Sig Start Date End Date Taking? Authorizing Provider  amLODipine (NORVASC) 5 MG tablet Take 1 tablet by mouth daily. 08/21/17   [provider]  apixaban (ELIQUIS) 2.5 MG TABS tablet Take 1 tablet (2.5 mg total) by mouth 2 (two) times daily. 03/08/18   Lockamy, Randi L, NP-C  atorvastatin (LIPITOR) 10 MG tablet Take 1 tablet by mouth daily. 08/21/17   [provider]  CALCIUM PO Take 2 tablets by mouth daily.    [provider]  CALCIUM PO Take by mouth.    [provider]  fexofenadine (ALLEGRA) 60 MG tablet Take 1 tablet by mouth daily as needed. 02/23/12   [provider]  hydrochlorothiazide (HYDRODIURIL) 25 MG tablet Take 1 tablet by mouth daily. 08/21/17   [provider]  ibuprofen (ADVIL,MOTRIN) 200 MG tablet Take 2 tablets by mouth 3 (three) times daily as needed. 08/04/99   [provider]  KLOR-CON M20 20 MEQ tablet Take 20 mEq by mouth daily. 11/07/17   [provider]  ondansetron (ZOFRAN) 4  MG tablet Take 4 mg by mouth 3 (three) times daily as needed. 10/24/17   [provider]  oxyCODONE-acetaminophen (PERCOCET/ROXICET) 5-325 MG tablet Take 1-2 tablets by mouth every 4 (four) hours as needed. for pain 10/12/17   [provider]  prochlorperazine (COMPAZINE) 10 MG tablet Take 10 mg by mouth every 6 (six) hours as needed. 10/24/17   [provider]  ramipril (ALTACE) 5 MG capsule Take 1 capsule by mouth daily. 08/21/17   [provider]  Vitamin D, Ergocalciferol, (DRISDOL) 50000 units CAPS capsule Take 1 capsule by mouth once a week. On Mondays 08/21/17   [provider]    Family History No family history on file.  Social History Social History   Tobacco Use  . Smoking status: Never Smoker  . Smokeless tobacco: Never Used  Substance Use Topics  . Alcohol use: Never    Frequency: Never  . Drug use: Never     Allergies   Penicillin g   Review of Systems Review of Systems  Constitutional: Negative for chills and fever.  HENT: Negative for facial swelling and trouble swallowing.   Respiratory: Negative for chest tightness, shortness of breath and wheezing.   Cardiovascular: Negative for chest pain.  Musculoskeletal: Negative for gait problem.  Skin: Positive for rash (bilateral erythematous rash on bilateral arms, improved).  Neurological: Negative for weakness and numbness.     Physical Exam Updated Vital Signs BP 128/72 (BP Location: Left Arm)   Pulse 100   Temp 98.2 F (36.8 C) (Oral)   Resp 18   Ht 5\' 4"  (1.626 m)   Wt 82.6 kg (182 lb)   SpO2 99%   BMI 31.24 kg/m   Physical Exam  Constitutional: She is oriented to person, place, and time. She appears well-developed and well-nourished. No distress.  No acute distress.   HENT:  Head: Normocephalic and atraumatic.  No facial swelling.  No angioedema.  Airway patent and able to handle oral secretions.  Eyes: Pupils are equal, round, and reactive to light.  Conjunctivae are normal. Right eye exhibits no discharge. Left eye exhibits no discharge.  Neck: Normal range of motion. Neck supple.  Cardiovascular: Normal rate and regular rhythm.  Murmur (systolic grade 2/6) heard. Pulmonary/Chest: Effort normal and breath sounds normal. No stridor. No respiratory distress. She has no wheezes. She has no rales.  Abdominal: Soft. Bowel sounds are normal. There is no tenderness.  Neurological: She is alert and oriented to person, place, and time. Coordination normal.  Skin: Skin is warm and dry. Capillary refill takes less than 2 seconds. She is not diaphoretic.  Very faint papular erythematous rash over the right inner arm.  Nontender to the touch.  No vesicles or pustules.  Psychiatric: She has a normal mood and affect. Her behavior is normal.  Nursing note and vitals reviewed.    ED Treatments / Results  Labs (all labs ordered are listed, but only abnormal results are displayed) Labs Reviewed - No data to display  EKG None  Radiology No results found.  Procedures Procedures (including critical care time)  Medications Ordered in ED Medications - No data to display   Initial Impression / Assessment and Plan / ED Course  I have reviewed the triage vital signs and the nursing notes.  Pertinent labs & imaging results that were available during my care of the patient were reviewed by me and considered in my medical decision making (see chart for details).    Patient presents with rash and pruritis after chemotherapy treatment earlier today. Likely allergic given infusion was given quicker today. She was given IV benadryl and steroid prior to arrival and reports nearly complete improvement. No acute distress. VSS. No angioedema, throat closing or trouble breathing. No concern for acute anaphylaxis. She has very faint erythematous papular rash on her right inner arm. Plan to discharge home with steroid burst and benadryl as needed for itching.  Is  able to tolerate p.o. fluids with intact airway.  Discussed return precautions and she agrees.   Final Clinical Impressions(s) / ED Diagnoses   Final diagnoses:  Allergic reaction, initial encounter    ED Discharge Orders        Ordered    predniSONE (DELTASONE) 20 MG tablet  Daily     03/22/18 1712       Glyn Ade, PA-C 03/22/18 1714    Julianne Rice, MD 03/22/18 [phone]

## 2018-03-22 NOTE — ED Triage Notes (Signed)
Fine red rash on her arms and feet since getting chemo today. She was given a steroid injection and Benadryl before coming here.

## 2018-03-27 ENCOUNTER — Inpatient Hospital Stay (HOSPITAL_COMMUNITY): Payer: Managed Care, Other (non HMO)

## 2018-03-27 ENCOUNTER — Inpatient Hospital Stay (HOSPITAL_BASED_OUTPATIENT_CLINIC_OR_DEPARTMENT_OTHER): Payer: Managed Care, Other (non HMO) | Admitting: Hematology

## 2018-03-27 ENCOUNTER — Encounter (HOSPITAL_COMMUNITY): Payer: Self-pay | Admitting: Hematology

## 2018-03-27 VITALS — BP 152/93 | HR 88 | Temp 98.5°F | Resp 14 | Wt 181.9 lb

## 2018-03-27 DIAGNOSIS — D701 Agranulocytosis secondary to cancer chemotherapy: Secondary | ICD-10-CM

## 2018-03-27 DIAGNOSIS — C50412 Malignant neoplasm of upper-outer quadrant of left female breast: Secondary | ICD-10-CM | POA: Diagnosis not present

## 2018-03-27 DIAGNOSIS — Z7901 Long term (current) use of anticoagulants: Secondary | ICD-10-CM

## 2018-03-27 DIAGNOSIS — Z171 Estrogen receptor negative status [ER-]: Secondary | ICD-10-CM

## 2018-03-27 DIAGNOSIS — T451X5S Adverse effect of antineoplastic and immunosuppressive drugs, sequela: Secondary | ICD-10-CM

## 2018-03-27 DIAGNOSIS — D702 Other drug-induced agranulocytosis: Secondary | ICD-10-CM

## 2018-03-27 DIAGNOSIS — R21 Rash and other nonspecific skin eruption: Secondary | ICD-10-CM

## 2018-03-27 DIAGNOSIS — Z86718 Personal history of other venous thrombosis and embolism: Secondary | ICD-10-CM | POA: Diagnosis not present

## 2018-03-27 LAB — CBC WITH DIFFERENTIAL/PLATELET
BASOS PCT: 3 %
Basophils Absolute: 0 10*3/uL (ref 0.0–0.1)
EOS ABS: 0 10*3/uL (ref 0.0–0.7)
Eosinophils Relative: 3 %
HCT: 38.9 % (ref 36.0–46.0)
HEMOGLOBIN: 12.6 g/dL (ref 12.0–15.0)
LYMPHS ABS: 0.6 10*3/uL — AB (ref 0.7–4.0)
Lymphocytes Relative: 50 %
MCH: 28.4 pg (ref 26.0–34.0)
MCHC: 32.4 g/dL (ref 30.0–36.0)
MCV: 87.8 fL (ref 78.0–100.0)
Monocytes Absolute: 0.2 10*3/uL (ref 0.1–1.0)
Monocytes Relative: 15 %
NEUTROS PCT: 29 %
Neutro Abs: 0.4 10*3/uL — ABNORMAL LOW (ref 1.7–7.7)
Platelets: 216 10*3/uL (ref 150–400)
RBC: 4.43 MIL/uL (ref 3.87–5.11)
RDW: 14.5 % (ref 11.5–15.5)
WBC: 1.2 10*3/uL — CL (ref 4.0–10.5)

## 2018-03-27 LAB — COMPREHENSIVE METABOLIC PANEL
ALBUMIN: 4.1 g/dL (ref 3.5–5.0)
ALT: 37 U/L (ref 0–44)
AST: 22 U/L (ref 15–41)
Alkaline Phosphatase: 67 U/L (ref 38–126)
Anion gap: 9 (ref 5–15)
BUN: 13 mg/dL (ref 8–23)
CO2: 25 mmol/L (ref 22–32)
CREATININE: 0.64 mg/dL (ref 0.44–1.00)
Calcium: 9.8 mg/dL (ref 8.9–10.3)
Chloride: 102 mmol/L (ref 98–111)
GFR calc Af Amer: >60 mL/min (ref >60)
GFR calc non Af Amer: >60 mL/min (ref >60)
Glucose, Bld: 113 mg/dL — ABNORMAL HIGH (ref 70–99)
POTASSIUM: 3.9 mmol/L (ref 3.5–5.1)
SODIUM: 136 mmol/L (ref 135–145)
Total Bilirubin: 1.2 mg/dL (ref 0.3–1.2)
Total Protein: 7.2 g/dL (ref 6.5–8.1)

## 2018-03-27 MED ORDER — FILGRASTIM 480 MCG/0.8ML IJ SOSY
480.0000 ug | PREFILLED_SYRINGE | Freq: Once | INTRAMUSCULAR | Status: AC
  Administered 2018-03-27: 480 ug via SUBCUTANEOUS
  Filled 2018-03-27: qty 0.8

## 2018-03-27 NOTE — Progress Notes (Signed)
Pt given NEupogen per Dr. Marthann Schiller orders. Pt given injection in her right arm. Pt tolerated injection well. Pt stable and discharged home ambulatory.

## 2018-03-27 NOTE — Progress Notes (Signed)
CRITICAL VALUE ALERT Critical value received:  WBC-1.2 Date of notification:  03/27/18 Time of notification: 1464 Critical value read back:  Yes.   Nurse who received alert:  M.Abhinav Mayorquin,LPN MD notified (1st page):  S.Katragadda, MD

## 2018-03-27 NOTE — Progress Notes (Signed)
[facility] [place], [place] [address]   CLINIC:  Medical Oncology/Hematology  PCP:  Eber Hong, MD [id number] [place] [id number] [phone]   REASON FOR VISIT:  Follow-up for breast cancer  CURRENT THERAPY: next step is surgery then follow afterwards  BRIEF ONCOLOGIC HISTORY:    Malignant neoplasm of upper-outer quadrant of left breast in female, estrogen receptor negative ([place])   09/26/2017 Initial Biopsy    Biopsy of the left breast and left axillary lymph node consistent with high-grade TNBC, Ki-67 of 80%  Clinical stage T1cN2 M0      09/26/2017 Family History    Sister had breast cancer at age 11, HER-2 positive, multiple paternal uncles had prostate cancer, father had prostate cancer, 2 paternal cousins had breast cancer       10/11/2017 Echocardiogram    Echocardiogram on 10/11/2017 showed ejection fraction of 65-70%      10/18/2017 Imaging    CT scan of the chest, abdomen and pelvis shows tiny circumscribed noncalcified nodule in the posterior segment of the left upper lobe of the lung with no other evidence of metastatic disease  MRI of the abdomen with and without contrast ordered for elevated total bilirubin on 10/29/2017 shows no evidence of liver metastasis      10/22/2017 Genetic Testing    [facility] myRisk test for BRCA 1 and 2 and other mutations negative      10/23/2017 -  Chemotherapy    The patient had DOXOrubicin (ADRIAMYCIN) chemo injection 114 mg, 60 mg/m2, Intravenous,  Once, 4 of 4 cycles Administration: 114 mg (12/05/2017) palonosetron (ALOXI) injection 0.25 mg, 0.25 mg, Intravenous,  Once, 8 of 8 cycles Administration: 0.25 mg (12/20/2017), 0.25 mg (12/05/2017), 0.25 mg (01/17/2018), 0.25 mg (12/27/2017), 0.25 mg (01/10/2018), 0.25 mg (01/25/2018), 0.25 mg (02/15/2018), 0.25 mg (02/22/2018), 0.25 mg (03/01/2018), 0.25 mg (03/15/2018), 0.25 mg (03/22/2018), 0.25 mg (02/08/2018), 0.25 mg (03/08/2018) pegfilgrastim (NEULASTA ONPRO  KIT) injection 6 mg, 6 mg, Subcutaneous, Once, 1 of 1 cycle Administration: 6 mg (12/05/2017) filgrastim (NEUPOGEN) injection 480 mcg, 480 mcg, Subcutaneous, Once PRN, 1 of 1 cycle pegfilgrastim-cbqv (UDENYCA) injection 6 mg, 6 mg, Subcutaneous, Once, 3 of 3 cycles CARBOplatin (PARAPLATIN) 230 mg in sodium chloride 0.9 % 250 mL chemo infusion, 230 mg (100 % of original dose 230.8 mg), Intravenous,  Once, 4 of 4 cycles Dose modification:   (original dose 230.8 mg, Cycle 5), 100 mg (original dose 100 mg, Cycle 6),   (original dose 230.8 mg, Cycle 5), 184.64 mg (80 % of original dose 230.8 mg, Cycle 5, Reason: Provider Judgment), 150 mg (original dose 100 mg, Cycle 7, Reason: Provider Judgment),   (original dose 100 mg, Cycle 7, Reason: Provider Judgment), 150 mg (original dose 100 mg, Cycle 7, Reason: Provider Judgment), 150 mg (original dose 100 mg, Cycle 8, Reason: Provider Judgment),   (original dose 100 mg, Cycle 8, Reason: Provider Judgment), 150 mg (original dose 100 mg, Cycle 6, Reason: Provider Judgment) Administration: 230 mg (12/20/2017), 100 mg (01/17/2018), 180 mg (12/27/2017), 150 mg (02/15/2018), 200 mg (02/22/2018), 150 mg (03/01/2018), 150 mg (03/15/2018), 200 mg (03/22/2018), 150 mg (02/08/2018), 100 mg (03/08/2018) cyclophosphamide (CYTOXAN) 1,140 mg in sodium chloride 0.9 % 250 mL chemo infusion, 600 mg/m2, Intravenous,  Once, 4 of 4 cycles Administration: 1,140 mg (12/05/2017) PACLitaxel (TAXOL) 150 mg in dextrose 5 % 250 mL chemo infusion (</= 74m/m2), 80 mg/m2 = 150 mg, Intravenous,  Once, 4 of 4 cycles Administration: 150 mg (  12/20/2017), 150 mg (12/27/2017), 150 mg (01/17/2018), 150 mg (01/10/2018), 150 mg (03/15/2018), 150 mg (01/25/2018), 150 mg (02/15/2018), 150 mg (02/22/2018), 150 mg (03/01/2018), 150 mg (03/22/2018), 150 mg (02/08/2018), 150 mg (03/08/2018) fosaprepitant (EMEND) 150 mg, dexamethasone (DECADRON) 12 mg in sodium chloride 0.9 % 145 mL IVPB, , Intravenous,  Once, 5 of 5 cycles Administration:   (12/20/2017),  (12/05/2017)  for chemotherapy treatment.       10/24/2017 -  Neo-Adjuvant Chemotherapy    Dose dense AC for 4 cycles followed by weekly carboplatin and paclitaxel for 12 weeks        CANCER STAGING: Cancer Staging Malignant neoplasm of upper-outer quadrant of left breast in female, estrogen receptor negative ([place]) Staging form: Breast, AJCC 8th Edition - Clinical: Stage IIIC (cT1, cN2, cM0, G3, ER: Negative, PR: Negative, HER2: Negative) - Signed by Holley Bouche, NP on 11/28/2017    INTERVAL HISTORY:  Brenda Richmond 67 y.o. female returns for routine follow-up for breast cancer. Patient had a reaction to her chemo last week. She has a rash on bilateral arms, lower back, and chest. Her face was pink and she felt flush. The reaction happened for about 2 hours and it resolved. She is feeling more weak than normal the past week. She is scheduled to see the breast surgeon in a week. Patient denies any new pains. Denies any nausea, vomiting, or diarrhea. Denies any numbness or tingling in hand or feet.     REVIEW OF SYSTEMS:  Review of Systems  Constitutional: Negative.   HENT:         Dry mouth  Eyes: Negative.   Respiratory: Negative.   Cardiovascular: Negative.   Gastrointestinal: Positive for abdominal pain.  Endocrine: Negative.   Genitourinary: Negative.    Skin: Negative.   Neurological: Negative.   Hematological: Negative.   Psychiatric/Behavioral: The patient is nervous/anxious.      PAST MEDICAL/SURGICAL HISTORY:  Past Medical History:  Diagnosis Date  . Breast cancer ([place])   . H/O seasonal allergies   . Hypertension   . Thrombosis    in right arm developed after 2nd round of chemotherapy   Past Surgical History:  Procedure Laterality Date  . BUNIONECTOMY Bilateral   . CYSTECTOMY     removed from left breast  . MELANOMA EXCISION Left    posterior forearm  . PORTA CATH INSERTION    . TUBAL LIGATION       SOCIAL HISTORY:  Social History     Socioeconomic History  . Marital status: Divorced    Spouse name: Not on file  . Number of children: Not on file  . Years of education: Not on file  . Highest education level: Not on file  Occupational History  . Not on file  Social Needs  . Financial resource strain: Not on file  . Food insecurity:    Worry: Not on file    Inability: Not on file  . Transportation needs:    Medical: Not on file    Non-medical: Not on file  Tobacco Use  . Smoking status: Never Smoker  . Smokeless tobacco: Never Used  Substance and Sexual Activity  . Alcohol use: Never    Frequency: Never  . Drug use: Never  . Sexual activity: Not Currently  Lifestyle  . Physical activity:    Days per week: Not on file    Minutes per session: Not on file  . Stress: Not on file  Relationships  . Social connections:  Talks on phone: Not on file    Gets together: Not on file    Attends religious service: Not on file    Active member of club or organization: Not on file    Attends meetings of clubs or organizations: Not on file    Relationship status: Not on file  . Intimate partner violence:    Fear of current or ex partner: Not on file    Emotionally abused: Not on file    Physically abused: Not on file    Forced sexual activity: Not on file  Other Topics Concern  . Not on file  Social History Narrative  . Not on file    FAMILY HISTORY:  History reviewed. No pertinent family history.  CURRENT MEDICATIONS:  Outpatient Encounter Medications as of 03/27/2018  Medication Sig  . amLODipine (NORVASC) 5 MG tablet Take 1 tablet by mouth daily.  Marland Kitchen apixaban (ELIQUIS) 2.5 MG TABS tablet Take 1 tablet (2.5 mg total) by mouth 2 (two) times daily.  Marland Kitchen atorvastatin (LIPITOR) 10 MG tablet Take 1 tablet by mouth daily.  Marland Kitchen CALCIUM PO Take 2 tablets by mouth daily.  Marland Kitchen CALCIUM PO Take by mouth.  . fexofenadine (ALLEGRA) 60 MG tablet Take 1 tablet by mouth daily as needed.  . hydrochlorothiazide (HYDRODIURIL)  25 MG tablet Take 1 tablet by mouth daily.  Marland Kitchen ibuprofen (ADVIL,MOTRIN) 200 MG tablet Take 2 tablets by mouth 3 (three) times daily as needed.  Marland Kitchen KLOR-CON M20 20 MEQ tablet Take 20 mEq by mouth daily.  . ondansetron (ZOFRAN) 4 MG tablet Take 4 mg by mouth 3 (three) times daily as needed.  Marland Kitchen oxyCODONE-acetaminophen (PERCOCET/ROXICET) 5-325 MG tablet Take 1-2 tablets by mouth every 4 (four) hours as needed. for pain  . prochlorperazine (COMPAZINE) 10 MG tablet Take 10 mg by mouth every 6 (six) hours as needed.  . ramipril (ALTACE) 5 MG capsule Take 1 capsule by mouth daily.  . Vitamin D, Ergocalciferol, (DRISDOL) 50000 units CAPS capsule Take 1 capsule by mouth once a week. On Mondays  . [EXPIRED] filgrastim (NEUPOGEN) injection 480 mcg    No facility-administered encounter medications on file as of 03/27/2018.     ALLERGIES:  Allergies  Allergen Reactions  . Penicillin G      PHYSICAL EXAM:  ECOG Performance status: 1  Vitals:   03/27/18 1117  BP: (!) 152/93  Pulse: 88  Resp: 14  Temp: 98.5 F (36.9 C)  SpO2: 99%   Filed Weights   03/27/18 1117  Weight: 181 lb 14.4 oz (82.5 kg)    Physical Exam Skin: No skin rash was seen today.  LABORATORY DATA:  I have reviewed the labs as listed.  CBC    Component Value Date/Time   WBC 1.2 (LL) 03/27/2018 1027   RBC 4.43 03/27/2018 1027   HGB 12.6 03/27/2018 1027   HGB 11.6 03/20/2018 1313   HCT 38.9 03/27/2018 1027   PLT 216 03/27/2018 1027   PLT 202 03/20/2018 1313   MCV 87.8 03/27/2018 1027   MCH 28.4 03/27/2018 1027   MCHC 32.4 03/27/2018 1027   RDW 14.5 03/27/2018 1027   LYMPHSABS 0.6 (L) 03/27/2018 1027   MONOABS 0.2 03/27/2018 1027   EOSABS 0.0 03/27/2018 1027   BASOSABS 0.0 03/27/2018 1027   CMP Latest Ref Rng & Units 03/27/2018 03/22/2018 03/20/2018  Glucose 70 - 99 mg/dL 113(H) 98 105(H)  BUN 8 - 23 mg/dL 13 13 13   Creatinine 0.44 - 1.00 mg/dL 0.64 0.61 0.76  Sodium 135 - 145 mmol/L 136 137 137  Potassium 3.5 -  5.1 mmol/L 3.9 3.4(L) 4.1  Chloride 98 - 111 mmol/L 102 102 100  CO2 22 - 32 mmol/L 25 26 29   Calcium 8.9 - 10.3 mg/dL 9.8 9.7 10.2  Total Protein 6.5 - 8.1 g/dL 7.2 7.2 7.2  Total Bilirubin 0.3 - 1.2 mg/dL 1.2 0.7 0.9  Alkaline Phos 38 - 126 U/L 67 77 78  AST 15 - 41 U/L 22 18 21   ALT 0 - 44 U/L 37 26 30        ASSESSMENT & PLAN:   Malignant neoplasm of upper-outer quadrant of left breast in female, estrogen receptor negative ([place]) 1.  Clinical stage IIIc (T1 cN2 M0) left breast TNBC:  - She completed 4 cycles of dose dense AC on 12/05/2017 - 12 cycles of weekly paclitaxel completed on 03/22/2018. - Today's examination reveals 2.5 x 2.5 cm left anterior axillary mass.  No clear breast masses felt. -I have made a referral to Dr. Donne Hazel for surgery.  She has an appointment to see him next week.  I will see her back 4 weeks after surgery. -She apparently had skin rash on the trunk and right upper extremity after last treatment of paclitaxel.  This has gotten better when she received steroids for 4 days from ER in [facility]. -Today her absolute neutrophil count is 400.  She will receive Neupogen.  2.  Genetic testing: She had extensive family history of malignancies.  We have done genetic testing for commonly inheritable genetic mutations, it was reported negative.   3.  Right upper extremity DVT: -She was diagnosed with right upper extremity DVT when she had port placed.  This happened end of February.  She was started on Eliquis on November 09, 2017.  She is currently taking 5 mg twice a day.  She has finished 4 months of treatment.  As she was having lots of bruising, her Eliquis dose was reduced to 2.5 mg twice daily.  We plan to discontinue it after the port comes out.      Orders placed this encounter:  Orders Placed This Encounter  Procedures  . CBC with Differential/Platelet  . Comprehensive metabolic panel      Derek Jack, MD [place] [phone]

## 2018-03-27 NOTE — Patient Instructions (Signed)
[facility] at [facility] Discharge Instructions  You saw Dr. Katragadda today.   Thank you for choosing [facility] at [facility] to provide your oncology and hematology care.  To afford each patient quality time with our provider, please arrive at least 15 minutes before your scheduled appointment time.   If you have a lab appointment with the Cancer Center please come in thru the  Main Entrance and check in at the main information desk  You need to re-schedule your appointment should you arrive 10 or more minutes late.  We strive to give you quality time with our providers, and arriving late affects you and other patients whose appointments are after yours.  Also, if you no show three or more times for appointments you may be dismissed from the clinic at the providers discretion.     Again, thank you for choosing [facility].  Our hope is that these requests will decrease the amount of time that you wait before being seen by our physicians.       _____________________________________________________________  Should you have questions after your visit to [facility], please contact our office at [phone] between the hours of 8:30 a.m. and 4:30 p.m.  Voicemails left after 4:30 p.m. will not be returned until the following business day.  For prescription refill requests, have your pharmacy contact our office.       Resources For Cancer Patients and their Caregivers ? American Cancer Society: Can assist with transportation, wigs, general needs, runs Look Good Feel Better.        1-888-227-6333 ? Cancer Care: Provides financial assistance, online support groups, medication/co-pay assistance.  1-800-813-HOPE (4673) ? [facility] Assists [place] cancer patients and their families through emotional , educational and financial support.  336-427-4357 ? [place] DSS Where to apply for  food stamps, Medicaid and utility assistance. 336-342-1394 ? [facility]: Transportation to medical appointments. 336-347-2287 ? Social Security Administration: May apply for disability if have a Stage IV cancer. 336-342-7796 1-800-772-1213 ? [place] Aging, Disability and Transit Services: Assists with nutrition, care and transit needs. 336-349-2343  Cancer Center Support Programs:   > Cancer Support Group  2nd Tuesday of the month 1pm-2pm, Journey Room   > Creative Journey  3rd Tuesday of the month 1130am-1pm, Journey Room     

## 2018-03-27 NOTE — Assessment & Plan Note (Signed)
1.  Clinical stage IIIc (T1 cN2 M0) left breast TNBC:  - She completed 4 cycles of dose dense AC on 12/05/2017 - 12 cycles of weekly paclitaxel completed on 03/22/2018. - Today's examination reveals 2.5 x 2.5 cm left anterior axillary mass.  No clear breast masses felt. -I have made a referral to Dr. Donne Hazel for surgery.  She has an appointment to see him next week.  I will see her back 4 weeks after surgery. -She apparently had skin rash on the trunk and right upper extremity after last treatment of paclitaxel.  This has gotten better when she received steroids for 4 days from ER in [facility]. -Today her absolute neutrophil count is 400.  She will receive Neupogen.  2.  Genetic testing: She had extensive family history of malignancies.  We have done genetic testing for commonly inheritable genetic mutations, it was reported negative.   3.  Right upper extremity DVT: -She was diagnosed with right upper extremity DVT when she had port placed.  This happened end of February.  She was started on Eliquis on November 09, 2017.  She is currently taking 5 mg twice a day.  She has finished 4 months of treatment.  As she was having lots of bruising, her Eliquis dose was reduced to 2.5 mg twice daily.  We plan to discontinue it after the port comes out.

## 2018-04-04 ENCOUNTER — Other Ambulatory Visit: Payer: Self-pay | Admitting: General Surgery

## 2018-04-04 DIAGNOSIS — Z171 Estrogen receptor negative status [ER-]: Principal | ICD-10-CM

## 2018-04-04 DIAGNOSIS — C50412 Malignant neoplasm of upper-outer quadrant of left female breast: Secondary | ICD-10-CM

## 2018-04-11 ENCOUNTER — Ambulatory Visit
Admission: RE | Admit: 2018-04-11 | Discharge: 2018-04-11 | Disposition: A | Discharge status: Home / Self Care | Payer: Managed Care, Other (non HMO) | Source: Ambulatory Visit | Attending: General Surgery | Admitting: General Surgery

## 2018-04-11 DIAGNOSIS — Z171 Estrogen receptor negative status [ER-]: Principal | ICD-10-CM

## 2018-04-11 DIAGNOSIS — C50412 Malignant neoplasm of upper-outer quadrant of left female breast: Secondary | ICD-10-CM

## 2018-04-11 MED ORDER — GADOBENATE DIMEGLUMINE 529 MG/ML IV SOLN
17.0000 mL | Freq: Once | INTRAVENOUS | Status: AC | PRN
  Administered 2018-04-11: 17 mL via INTRAVENOUS

## 2018-04-15 ENCOUNTER — Encounter (HOSPITAL_COMMUNITY)
Admission: RE | Admit: 2018-04-15 | Discharge: 2018-04-15 | Disposition: A | Discharge status: Home / Self Care | Payer: Managed Care, Other (non HMO) | Source: Ambulatory Visit | Attending: General Surgery | Admitting: General Surgery

## 2018-04-15 ENCOUNTER — Encounter (HOSPITAL_COMMUNITY): Payer: Self-pay | Admitting: Urology

## 2018-04-15 ENCOUNTER — Other Ambulatory Visit: Payer: Self-pay

## 2018-04-15 DIAGNOSIS — Z01812 Encounter for preprocedural laboratory examination: Secondary | ICD-10-CM | POA: Insufficient documentation

## 2018-04-15 DIAGNOSIS — I10 Essential (primary) hypertension: Secondary | ICD-10-CM | POA: Insufficient documentation

## 2018-04-15 DIAGNOSIS — Z01810 Encounter for preprocedural cardiovascular examination: Secondary | ICD-10-CM | POA: Diagnosis present

## 2018-04-15 HISTORY — DX: Cardiac murmur, unspecified: R01.1

## 2018-04-15 HISTORY — DX: Malignant melanoma of skin, unspecified: C43.9

## 2018-04-15 LAB — BASIC METABOLIC PANEL WITH GFR
Anion gap: 11 (ref 5–15)
BUN: 11 mg/dL (ref 8–23)
CO2: 29 mmol/L (ref 22–32)
Calcium: 10.3 mg/dL (ref 8.9–10.3)
Chloride: 100 mmol/L (ref 98–111)
Creatinine, Ser: 0.71 mg/dL (ref 0.44–1.00)
GFR calc Af Amer: >60 mL/min
GFR calc non Af Amer: >60 mL/min
Glucose, Bld: 101 mg/dL — ABNORMAL HIGH (ref 70–99)
Potassium: 4.1 mmol/L (ref 3.5–5.1)
Sodium: 140 mmol/L (ref 135–145)

## 2018-04-15 LAB — CBC
HCT: 40.6 % (ref 36.0–46.0)
Hemoglobin: 12.5 g/dL (ref 12.0–15.0)
MCH: 28 pg (ref 26.0–34.0)
MCHC: 30.8 g/dL (ref 30.0–36.0)
MCV: 91 fL (ref 78.0–100.0)
PLATELETS: 246 10*3/uL (ref 150–400)
RBC: 4.46 MIL/uL (ref 3.87–5.11)
RDW: 14.4 % (ref 11.5–15.5)
WBC: 2.5 10*3/uL — AB (ref 4.0–10.5)

## 2018-04-15 NOTE — Pre-Procedure Instructions (Addendum)
Novaleigh Gonser  04/15/2018      [id number] - [place], [place] - [address] [place] [address] Phone: [phone] Fax: [phone]    Your procedure is scheduled on Fri., Aug. 9, 2019 from 9:30AM-12:00PM  Report to [facility] Admitting Entrance "A" at 7:30AM  Call this number if you have problems the morning of surgery:  [phone]   Remember:  Do not eat after midnight on Aug. 8th  You may drink clear liquids until 3 hours (6:30AM) prior to surgery .  Clear liquids allowed are: Water and Gatorade   Please complete your PRE-SURGERY ENSURE that was given to by 6:30AM the morning of surgery.  Please, if able, drink it in one setting. DO NOT SIP.   Take these medicines the morning of surgery with A SIP OF WATER By 6:30AM:  If needed Fexofenadine (ALLEGRA)  Follow your surgeon's instructions on when to stop Eliquis.  If no instructions were given by your surgeon then you will need to call the office to get those instructions.    As of today, stop taking all Other Aspirin Products, Vitamins, Fish oils, and Herbal medications. Also stop all NSAIDS i.e. Advil, Ibuprofen, Motrin, Aleve, Anaprox, Naproxen, BC, Goody Powders, and all Supplements.   Do not wear jewelry, make-up or nail polish.  Do not wear lotions, powders, or perfumes, or deodorant.  Do not shave 48 hours prior to surgery.    Do not bring valuables to the hospital.  [facility] is not responsible for any belongings or valuables.  Contacts, dentures or bridgework may not be worn into surgery.  Leave your suitcase in the car.  After surgery it may be brought to your room.  For patients admitted to the hospital, discharge time will be determined by your treatment team.  Patients discharged the day of surgery will not be allowed to drive home.   Special instructions: [facility]- Preparing For Surgery  Before surgery, you can play an  important role. Because skin is not sterile, your skin needs to be as free of germs as possible. You can reduce the number of germs on your skin by washing with CHG (chlorahexidine gluconate) Soap before surgery.  CHG is an antiseptic cleaner which kills germs and bonds with the skin to continue killing germs even after washing.    Oral Hygiene is also important to reduce your risk of infection.  Remember - BRUSH YOUR TEETH THE MORNING OF SURGERY WITH YOUR REGULAR TOOTHPASTE  Please do not use if you have an allergy to CHG or antibacterial soaps. If your skin becomes reddened/irritated stop using the CHG.  Do not shave (including legs and underarms) for at least 48 hours prior to first CHG shower. It is OK to shave your face.  Please follow these instructions carefully.   1. Shower the NIGHT BEFORE SURGERY and the MORNING OF SURGERY with CHG.   2. If you chose to wash your hair, wash your hair first as usual with your normal shampoo.  3. After you shampoo, rinse your hair and body thoroughly to remove the shampoo.  4. Use CHG as you would any other liquid soap. You can apply CHG directly to the skin and wash gently with a scrungie or a clean washcloth.   5. Apply the CHG Soap to your body ONLY FROM THE NECK DOWN.  Do not use on open wounds or open sores. Avoid contact  with your eyes, ears, mouth and genitals (private parts). Wash Face and genitals (private parts)  with your normal soap.  6. Wash thoroughly, paying special attention to the area where your surgery will be performed.  7. Thoroughly rinse your body with warm water from the neck down.  8. DO NOT shower/wash with your normal soap after using and rinsing off the CHG Soap.  9. Pat yourself dry with a CLEAN TOWEL.  10. Wear CLEAN PAJAMAS to bed the night before surgery, wear comfortable clothes the morning of surgery  11. Place CLEAN SHEETS on your bed the night of your first shower and DO NOT SLEEP WITH PETS.  Day of  Surgery:  Do not apply any deodorants/lotions.  Please wear clean clothes to the hospital/surgery center.   Remember to brush your teeth WITH YOUR REGULAR TOOTHPASTE.  Please read over the following fact sheets that you were given. Pain Booklet, Coughing and Deep Breathing and Surgical Site Infection Prevention

## 2018-04-15 NOTE — Progress Notes (Addendum)
PCP -  Cardiologist - denies cardiologist  Chest x-ray - N/A EKG - 04/15/2018  Stress Test - >15 years ago, normal, no cardiac hx per patient  Pt was not given instructions on Eliquis. Left message for triage nurse at Dr. Cristal Generous office. Notified patient to call office if she did not hear anything from them by this afternoon. --Spoke with Nurse at Dr. Cristal Generous office, who states per Dr. Melissa Montane note, pt needs to be off Eliquis for 2 days. Pt notified.   Patient denies shortness of breath, fever, cough and chest pain at PAT appointment   Patient verbalized understanding of instructions that were given to them at the PAT appointment. Patient was also instructed that they will need to review over the PAT instructions again at home before surgery.

## 2018-04-19 ENCOUNTER — Other Ambulatory Visit: Payer: Self-pay

## 2018-04-19 ENCOUNTER — Observation Stay (HOSPITAL_COMMUNITY)
Admission: RE | Admit: 2018-04-19 | Discharge: 2018-04-20 | Disposition: A | Discharge status: Home / Self Care | Payer: Managed Care, Other (non HMO) | Source: Ambulatory Visit | Attending: General Surgery | Admitting: General Surgery

## 2018-04-19 ENCOUNTER — Encounter (HOSPITAL_COMMUNITY): Payer: Self-pay | Admitting: Surgery

## 2018-04-19 ENCOUNTER — Ambulatory Visit (HOSPITAL_COMMUNITY): Payer: Managed Care, Other (non HMO) | Admitting: Physician Assistant

## 2018-04-19 ENCOUNTER — Ambulatory Visit (HOSPITAL_COMMUNITY): Payer: Managed Care, Other (non HMO) | Admitting: Certified Registered Nurse Anesthetist

## 2018-04-19 ENCOUNTER — Encounter (HOSPITAL_COMMUNITY)
Admission: RE | Disposition: A | Discharge status: Home / Self Care | Payer: Self-pay | Source: Ambulatory Visit | Attending: General Surgery

## 2018-04-19 DIAGNOSIS — I10 Essential (primary) hypertension: Secondary | ICD-10-CM | POA: Diagnosis not present

## 2018-04-19 DIAGNOSIS — Z9221 Personal history of antineoplastic chemotherapy: Secondary | ICD-10-CM | POA: Diagnosis not present

## 2018-04-19 DIAGNOSIS — Z8249 Family history of ischemic heart disease and other diseases of the circulatory system: Secondary | ICD-10-CM | POA: Diagnosis not present

## 2018-04-19 DIAGNOSIS — E7800 Pure hypercholesterolemia, unspecified: Secondary | ICD-10-CM | POA: Insufficient documentation

## 2018-04-19 DIAGNOSIS — Z880 Allergy status to penicillin: Secondary | ICD-10-CM | POA: Insufficient documentation

## 2018-04-19 DIAGNOSIS — Z91040 Latex allergy status: Secondary | ICD-10-CM | POA: Insufficient documentation

## 2018-04-19 DIAGNOSIS — K219 Gastro-esophageal reflux disease without esophagitis: Secondary | ICD-10-CM | POA: Diagnosis not present

## 2018-04-19 DIAGNOSIS — C773 Secondary and unspecified malignant neoplasm of axilla and upper limb lymph nodes: Secondary | ICD-10-CM | POA: Diagnosis not present

## 2018-04-19 DIAGNOSIS — Z85820 Personal history of malignant melanoma of skin: Secondary | ICD-10-CM | POA: Insufficient documentation

## 2018-04-19 DIAGNOSIS — R011 Cardiac murmur, unspecified: Secondary | ICD-10-CM | POA: Diagnosis not present

## 2018-04-19 DIAGNOSIS — Z7901 Long term (current) use of anticoagulants: Secondary | ICD-10-CM | POA: Insufficient documentation

## 2018-04-19 DIAGNOSIS — Z452 Encounter for adjustment and management of vascular access device: Secondary | ICD-10-CM | POA: Diagnosis not present

## 2018-04-19 DIAGNOSIS — Z171 Estrogen receptor negative status [ER-]: Secondary | ICD-10-CM | POA: Diagnosis not present

## 2018-04-19 DIAGNOSIS — C50412 Malignant neoplasm of upper-outer quadrant of left female breast: Secondary | ICD-10-CM | POA: Diagnosis not present

## 2018-04-19 DIAGNOSIS — Z79899 Other long term (current) drug therapy: Secondary | ICD-10-CM | POA: Diagnosis not present

## 2018-04-19 DIAGNOSIS — C50912 Malignant neoplasm of unspecified site of left female breast: Secondary | ICD-10-CM | POA: Diagnosis present

## 2018-04-19 HISTORY — PX: MASTECTOMY MODIFIED RADICAL: SUR848

## 2018-04-19 HISTORY — PX: PORTA CATH REMOVAL: CATH118286

## 2018-04-19 HISTORY — DX: Nausea with vomiting, unspecified: R11.2

## 2018-04-19 HISTORY — PX: MASTECTOMY MODIFIED RADICAL: SHX5962

## 2018-04-19 HISTORY — PX: PORT-A-CATH REMOVAL: SHX5289

## 2018-04-19 HISTORY — DX: Malignant neoplasm of unspecified site of left female breast: C50.912

## 2018-04-19 HISTORY — DX: Other specified postprocedural states: Z98.890

## 2018-04-19 HISTORY — DX: Pure hypercholesterolemia, unspecified: E78.00

## 2018-04-19 LAB — PROTIME-INR
INR: 1.03
Prothrombin Time: 13.4 seconds (ref 11.4–15.2)

## 2018-04-19 SURGERY — MASTECTOMY, MODIFIED RADICAL
Anesthesia: General | Site: Chest | Laterality: Right

## 2018-04-19 MED ORDER — PROPOFOL 10 MG/ML IV BOLUS
INTRAVENOUS | Status: AC
  Filled 2018-04-19: qty 20

## 2018-04-19 MED ORDER — MIDAZOLAM HCL 5 MG/5ML IJ SOLN
INTRAMUSCULAR | Status: DC | PRN
  Administered 2018-04-19 (×2): 1 mg via INTRAVENOUS

## 2018-04-19 MED ORDER — GABAPENTIN 100 MG PO CAPS
100.0000 mg | ORAL_CAPSULE | ORAL | Status: AC
  Administered 2018-04-19: 100 mg via ORAL
  Filled 2018-04-19: qty 1

## 2018-04-19 MED ORDER — CHLORHEXIDINE GLUCONATE CLOTH 2 % EX PADS: 6.0000 | MEDICATED_PAD | Freq: Once | CUTANEOUS | Status: DC

## 2018-04-19 MED ORDER — FENTANYL CITRATE (PF) 250 MCG/5ML IJ SOLN
INTRAMUSCULAR | Status: DC | PRN
  Administered 2018-04-19 (×5): 50 ug via INTRAVENOUS

## 2018-04-19 MED ORDER — POLYETHYLENE GLYCOL 3350 17 G PO PACK: 17.0000 g | PACK | Freq: Every day | ORAL | Status: DC | PRN

## 2018-04-19 MED ORDER — 0.9 % SODIUM CHLORIDE (POUR BTL) OPTIME
TOPICAL | Status: DC | PRN
  Administered 2018-04-19: 1000 mL

## 2018-04-19 MED ORDER — ONDANSETRON HCL 4 MG/2ML IJ SOLN
INTRAMUSCULAR | Status: DC | PRN
  Administered 2018-04-19: 4 mg via INTRAVENOUS

## 2018-04-19 MED ORDER — BUPIVACAINE-EPINEPHRINE (PF) 0.5% -1:200000 IJ SOLN
INTRAMUSCULAR | Status: DC | PRN
  Administered 2018-04-19: 30 mL

## 2018-04-19 MED ORDER — HYDROCHLOROTHIAZIDE 25 MG PO TABS: 25.0000 mg | ORAL_TABLET | Freq: Every day | ORAL | Status: DC

## 2018-04-19 MED ORDER — CIPROFLOXACIN IN D5W 400 MG/200ML IV SOLN
400.0000 mg | INTRAVENOUS | Status: AC
  Administered 2018-04-19: 400 mg via INTRAVENOUS
  Filled 2018-04-19: qty 200

## 2018-04-19 MED ORDER — POTASSIUM CHLORIDE CRYS ER 20 MEQ PO TBCR: 20.0000 meq | EXTENDED_RELEASE_TABLET | Freq: Every day | ORAL | Status: DC

## 2018-04-19 MED ORDER — ACETAMINOPHEN 500 MG PO TABS
1000.0000 mg | ORAL_TABLET | ORAL | Status: AC
  Administered 2018-04-19: 1000 mg via ORAL
  Filled 2018-04-19: qty 2

## 2018-04-19 MED ORDER — LACTATED RINGERS IV SOLN
INTRAVENOUS | Status: DC | PRN
  Administered 2018-04-19 (×2): via INTRAVENOUS

## 2018-04-19 MED ORDER — DEXAMETHASONE SODIUM PHOSPHATE 10 MG/ML IJ SOLN
INTRAMUSCULAR | Status: AC
  Filled 2018-04-19: qty 1

## 2018-04-19 MED ORDER — SODIUM CHLORIDE 0.9 % IV SOLN
INTRAVENOUS | Status: DC | PRN
  Administered 2018-04-19: 20 ug/min via INTRAVENOUS

## 2018-04-19 MED ORDER — MIDAZOLAM HCL 2 MG/2ML IJ SOLN
INTRAMUSCULAR | Status: AC
  Filled 2018-04-19: qty 2

## 2018-04-19 MED ORDER — ROCURONIUM BROMIDE 10 MG/ML (PF) SYRINGE
PREFILLED_SYRINGE | INTRAVENOUS | Status: AC
  Filled 2018-04-19: qty 10

## 2018-04-19 MED ORDER — PHENYLEPHRINE 40 MCG/ML (10ML) SYRINGE FOR IV PUSH (FOR BLOOD PRESSURE SUPPORT)
PREFILLED_SYRINGE | INTRAVENOUS | Status: DC | PRN
  Administered 2018-04-19 (×3): 80 ug via INTRAVENOUS

## 2018-04-19 MED ORDER — LACTATED RINGERS IV SOLN: INTRAVENOUS | Status: DC

## 2018-04-19 MED ORDER — BUPIVACAINE-EPINEPHRINE 0.25% -1:200000 IJ SOLN
INTRAMUSCULAR | Status: DC | PRN
  Administered 2018-04-19: 10 mL

## 2018-04-19 MED ORDER — LIDOCAINE 2% (20 MG/ML) 5 ML SYRINGE
INTRAMUSCULAR | Status: AC
  Filled 2018-04-19: qty 5

## 2018-04-19 MED ORDER — BUPIVACAINE-EPINEPHRINE (PF) 0.25% -1:200000 IJ SOLN
INTRAMUSCULAR | Status: AC
  Filled 2018-04-19: qty 30

## 2018-04-19 MED ORDER — FENTANYL CITRATE (PF) 250 MCG/5ML IJ SOLN
INTRAMUSCULAR | Status: AC
  Filled 2018-04-19: qty 5

## 2018-04-19 MED ORDER — METHOCARBAMOL 500 MG PO TABS: 500.0000 mg | ORAL_TABLET | Freq: Four times a day (QID) | ORAL | Status: DC | PRN

## 2018-04-19 MED ORDER — SODIUM CHLORIDE 0.9 % IV SOLN
INTRAVENOUS | Status: DC
  Administered 2018-04-19: 13:00:00 via INTRAVENOUS

## 2018-04-19 MED ORDER — DEXAMETHASONE SODIUM PHOSPHATE 10 MG/ML IJ SOLN
INTRAMUSCULAR | Status: DC | PRN
  Administered 2018-04-19: 10 mg via INTRAVENOUS

## 2018-04-19 MED ORDER — MEPERIDINE HCL 50 MG/ML IJ SOLN: 6.2500 mg | INTRAMUSCULAR | Status: DC | PRN

## 2018-04-19 MED ORDER — ROCURONIUM BROMIDE 10 MG/ML (PF) SYRINGE
PREFILLED_SYRINGE | INTRAVENOUS | Status: DC | PRN
  Administered 2018-04-19: 50 mg via INTRAVENOUS

## 2018-04-19 MED ORDER — OXYCODONE HCL 5 MG PO TABS: 5.0000 mg | ORAL_TABLET | ORAL | Status: DC | PRN

## 2018-04-19 MED ORDER — ONDANSETRON 4 MG PO TBDP: 4.0000 mg | ORAL_TABLET | Freq: Four times a day (QID) | ORAL | Status: DC | PRN

## 2018-04-19 MED ORDER — HYDROMORPHONE HCL 1 MG/ML IJ SOLN
INTRAMUSCULAR | Status: AC
  Filled 2018-04-19: qty 1

## 2018-04-19 MED ORDER — HYDROMORPHONE HCL 1 MG/ML IJ SOLN
0.2500 mg | INTRAMUSCULAR | Status: DC | PRN
  Administered 2018-04-19 (×2): 0.5 mg via INTRAVENOUS

## 2018-04-19 MED ORDER — ONDANSETRON HCL 4 MG/2ML IJ SOLN: 4.0000 mg | Freq: Once | INTRAMUSCULAR | Status: DC | PRN

## 2018-04-19 MED ORDER — FENTANYL CITRATE (PF) 100 MCG/2ML IJ SOLN
INTRAMUSCULAR | Status: AC
  Filled 2018-04-19: qty 2

## 2018-04-19 MED ORDER — SUGAMMADEX SODIUM 200 MG/2ML IV SOLN
INTRAVENOUS | Status: DC | PRN
  Administered 2018-04-19: 200 mg via INTRAVENOUS

## 2018-04-19 MED ORDER — AMLODIPINE BESYLATE 5 MG PO TABS: 5.0000 mg | ORAL_TABLET | Freq: Every day | ORAL | Status: DC

## 2018-04-19 MED ORDER — ACETAMINOPHEN 500 MG PO TABS
1000.0000 mg | ORAL_TABLET | Freq: Four times a day (QID) | ORAL | Status: DC
  Administered 2018-04-19 – 2018-04-20 (×4): 1000 mg via ORAL
  Filled 2018-04-19 (×4): qty 2

## 2018-04-19 MED ORDER — RAMIPRIL 5 MG PO CAPS
5.0000 mg | ORAL_CAPSULE | Freq: Every day | ORAL | Status: DC
  Filled 2018-04-19: qty 1

## 2018-04-19 MED ORDER — ONDANSETRON HCL 4 MG/2ML IJ SOLN
INTRAMUSCULAR | Status: AC
  Filled 2018-04-19: qty 2

## 2018-04-19 MED ORDER — ONDANSETRON HCL 4 MG/2ML IJ SOLN
4.0000 mg | Freq: Four times a day (QID) | INTRAMUSCULAR | Status: DC | PRN
  Administered 2018-04-19 (×2): 4 mg via INTRAVENOUS
  Filled 2018-04-19 (×2): qty 2

## 2018-04-19 MED ORDER — ENSURE PRE-SURGERY PO LIQD
296.0000 mL | Freq: Once | ORAL | Status: DC
  Filled 2018-04-19: qty 296

## 2018-04-19 MED ORDER — ENOXAPARIN SODIUM 40 MG/0.4ML ~~LOC~~ SOLN
40.0000 mg | SUBCUTANEOUS | Status: DC
  Administered 2018-04-20: 40 mg via SUBCUTANEOUS
  Filled 2018-04-19: qty 0.4

## 2018-04-19 MED ORDER — MORPHINE SULFATE (PF) 2 MG/ML IV SOLN: 1.0000 mg | INTRAVENOUS | Status: DC | PRN

## 2018-04-19 MED ORDER — HEMOSTATIC AGENTS (NO CHARGE) OPTIME
TOPICAL | Status: DC | PRN
  Administered 2018-04-19: 1 via TOPICAL

## 2018-04-19 MED ORDER — LIDOCAINE 2% (20 MG/ML) 5 ML SYRINGE
INTRAMUSCULAR | Status: DC | PRN
  Administered 2018-04-19: 100 mg via INTRAVENOUS

## 2018-04-19 MED ORDER — PROPOFOL 10 MG/ML IV BOLUS
INTRAVENOUS | Status: DC | PRN
  Administered 2018-04-19: 50 mg via INTRAVENOUS
  Administered 2018-04-19: 150 mg via INTRAVENOUS

## 2018-04-19 MED ORDER — LORATADINE 10 MG PO TABS: 10.0000 mg | ORAL_TABLET | Freq: Every day | ORAL | Status: DC

## 2018-04-19 MED ORDER — PHENYLEPHRINE 40 MCG/ML (10ML) SYRINGE FOR IV PUSH (FOR BLOOD PRESSURE SUPPORT)
PREFILLED_SYRINGE | INTRAVENOUS | Status: AC
  Filled 2018-04-19: qty 10

## 2018-04-19 SURGICAL SUPPLY — 57 items
APPLIER CLIP 9.375 MED OPEN (MISCELLANEOUS) ×12
BINDER BREAST XLRG (GAUZE/BANDAGES/DRESSINGS) ×4 IMPLANT
BIOPATCH RED 1 DISK 7.0 (GAUZE/BANDAGES/DRESSINGS) ×6 IMPLANT
BIOPATCH RED 1IN DISK 7.0MM (GAUZE/BANDAGES/DRESSINGS) ×2
CANISTER SUCT 3000ML PPV (MISCELLANEOUS) ×4 IMPLANT
CHLORAPREP W/TINT 26ML (MISCELLANEOUS) ×4 IMPLANT
CLIP APPLIE 9.375 MED OPEN (MISCELLANEOUS) ×6 IMPLANT
CLOSURE WOUND 1/2 X4 (GAUZE/BANDAGES/DRESSINGS) ×3
COVER SURGICAL LIGHT HANDLE (MISCELLANEOUS) ×4 IMPLANT
DECANTER SPIKE VIAL GLASS SM (MISCELLANEOUS) ×4 IMPLANT
DERMABOND ADVANCED (GAUZE/BANDAGES/DRESSINGS) ×6
DERMABOND ADVANCED .7 DNX12 (GAUZE/BANDAGES/DRESSINGS) ×6 IMPLANT
DRAIN CHANNEL 19F RND (DRAIN) ×8 IMPLANT
DRAPE HALF SHEET 40X57 (DRAPES) ×4 IMPLANT
DRAPE LAPAROSCOPIC ABDOMINAL (DRAPES) ×4 IMPLANT
DRSG TEGADERM 4X4.75 (GAUZE/BANDAGES/DRESSINGS) ×4 IMPLANT
ELECT BLADE 4.0 EZ CLEAN MEGAD (MISCELLANEOUS) ×4
ELECT CAUTERY BLADE 6.4 (BLADE) ×4 IMPLANT
ELECT REM PT RETURN 9FT ADLT (ELECTROSURGICAL) ×4
ELECTRODE BLDE 4.0 EZ CLN MEGD (MISCELLANEOUS) ×2 IMPLANT
ELECTRODE REM PT RTRN 9FT ADLT (ELECTROSURGICAL) ×2 IMPLANT
EVACUATOR SILICONE 100CC (DRAIN) ×8 IMPLANT
GAUZE SPONGE 4X4 12PLY STRL (GAUZE/BANDAGES/DRESSINGS) ×4 IMPLANT
GLOVE BIOGEL PI IND STRL 7.0 (GLOVE) ×4 IMPLANT
GLOVE BIOGEL PI IND STRL 7.5 (GLOVE) ×2 IMPLANT
GLOVE BIOGEL PI INDICATOR 7.0 (GLOVE) ×4
GLOVE BIOGEL PI INDICATOR 7.5 (GLOVE) ×2
GLOVE SURG SS PI 6.5 STRL IVOR (GLOVE) ×4 IMPLANT
GLOVE SURG SS PI 7.0 STRL IVOR (GLOVE) ×12 IMPLANT
GOWN STRL REUS W/ TWL LRG LVL3 (GOWN DISPOSABLE) ×8 IMPLANT
GOWN STRL REUS W/TWL LRG LVL3 (GOWN DISPOSABLE) ×8
HEMOSTAT ARISTA ABSORB 3G PWDR (MISCELLANEOUS) ×4 IMPLANT
KIT BASIN OR (CUSTOM PROCEDURE TRAY) ×4 IMPLANT
KIT TURNOVER KIT B (KITS) ×4 IMPLANT
LIGHT WAVEGUIDE WIDE FLAT (MISCELLANEOUS) ×4 IMPLANT
MARKER SKIN DUAL TIP RULER LAB (MISCELLANEOUS) ×4 IMPLANT
NEEDLE HYPO 25GX1X1/2 BEV (NEEDLE) ×4 IMPLANT
NS IRRIG 1000ML POUR BTL (IV SOLUTION) ×4 IMPLANT
PACK GENERAL/GYN (CUSTOM PROCEDURE TRAY) ×4 IMPLANT
PAD ARMBOARD 7.5X6 YLW CONV (MISCELLANEOUS) ×4 IMPLANT
SPECIMEN JAR X LARGE (MISCELLANEOUS) IMPLANT
SPONGE LAP 18X18 X RAY DECT (DISPOSABLE) ×8 IMPLANT
STAPLER VISISTAT 35W (STAPLE) IMPLANT
STRIP CLOSURE SKIN 1/2X4 (GAUZE/BANDAGES/DRESSINGS) ×9 IMPLANT
SUT ETHILON 2 0 FS 18 (SUTURE) ×8 IMPLANT
SUT MNCRL AB 4-0 PS2 18 (SUTURE) ×8 IMPLANT
SUT SILK 2 0 SH (SUTURE) ×4 IMPLANT
SUT VIC AB 2-0 BRD 54 (SUTURE) ×4 IMPLANT
SUT VIC AB 3-0 54X BRD REEL (SUTURE) ×2 IMPLANT
SUT VIC AB 3-0 BRD 54 (SUTURE) ×2
SUT VIC AB 3-0 SH 27 (SUTURE)
SUT VIC AB 3-0 SH 27X BRD (SUTURE) IMPLANT
SUT VIC AB 3-0 SH 27XBRD (SUTURE) IMPLANT
SUT VIC AB 3-0 SH 8-18 (SUTURE) ×12 IMPLANT
SYR CONTROL 10ML LL (SYRINGE) ×4 IMPLANT
TOWEL OR 17X24 6PK STRL BLUE (TOWEL DISPOSABLE) ×4 IMPLANT
TOWEL OR 17X26 10 PK STRL BLUE (TOWEL DISPOSABLE) ×4 IMPLANT

## 2018-04-19 NOTE — H&P (Signed)
67 yof referred by Dr Jamey Reas who noted some months prior to presentation a mass in lower left axilla/left breast. she has not had mri. she has no nipple dc. she was seen outside facility and was noted to have a mm and [place] that showed lymph node 2.9x1.8x2.2cm and breast mass measuring 1.5x1.6.x1.5 cm mass. this mass by the notes palpable was 4x2.5 cm mass. she underwent a US guided core of breast mass and axilla that showed triple negative breast tumor and node at 80%. she had right subclavian port placed. she underwent ddAC at [facility]. not sure about response. she still has a mass present. she has dvt associated with port apparently and is on eliquis. she is here with her family finishing chemo on 7/12. she has genetics that was negative.   Past Surgical History Illene Regulus, CMA; 04/04/2018 10:16 AM) Breast Biopsy  Left. Colon Polyp Removal - Colonoscopy  Foot Surgery  Bilateral.  Diagnostic Studies History Lars Mage Spillers, CMA; 04/04/2018 10:16 AM) Colonoscopy  within last year Mammogram  within last year Pap Smear  1-5 years ago  Allergies Illene Regulus, CMA; 04/04/2018 10:14 AM) No Known Drug Allergies [04/04/2018]:  Medication History (Alisha Spillers, CMA; 04/04/2018 10:16 AM) Ramipril (5MG  Capsule, Oral) Active. HydroCHLOROthiazide (25MG  Tablet, Oral) Active. AmLODIPine Besylate (5MG  Tablet, Oral) Active. Vitamin D (Ergocalciferol) (50000UNIT Capsule, Oral) Active. Eliquis (2.5MG  Tablet, Oral) Active. Multivitamin Adults (Oral) Active. Calcium Carbonate (1250 (500 Ca)MG Tablet Chewable, Oral) Active. Ergocalciferol (400UNIT Tablet, Oral) Active. Medications Reconciled  Social History Illene Regulus, CMA; 04/04/2018 10:16 AM) Caffeine use  Coffee. No alcohol use  No drug use  Tobacco use  Never smoker.  Family History Illene Regulus, CMA; 04/04/2018 10:16 AM) Breast Cancer  Sister. Colon Polyps  Father, Sister. Heart Disease   Brother. Heart disease in female family member before age 54  Hypertension  Brother, Father, Mother, Sister. Prostate Cancer  Brother, Father.  Pregnancy / Birth History Illene Regulus, CMA; 04/04/2018 10:16 AM) Age at menarche  62 years. Age of menopause  >61 Contraceptive History  Oral contraceptives. Gravida  2 Length (months) of breastfeeding  3-6 Maternal age  34-25 Para  2 Regular periods   Other Problems Illene Regulus, CMA; 04/04/2018 10:16 AM) Breast Cancer  Heart murmur  High blood pressure  Hypercholesterolemia  Lump In Breast  Melanoma     Review of Systems (Alisha Spillers CMA; 04/04/2018 10:16 AM) General Not Present- Appetite Loss, Chills, Fatigue, Fever, Night Sweats, Weight Gain and Weight Loss. Skin Present- Dryness. Not Present- Change in Wart/Mole, Hives, Jaundice, New Lesions, Non-Healing Wounds, Rash and Ulcer. HEENT Present- Nose Bleed, Seasonal Allergies and Wears glasses/contact lenses. Not Present- Earache, Hearing Loss, Hoarseness, Oral Ulcers, Ringing in the Ears, Sinus Pain, Sore Throat, Visual Disturbances and Yellow Eyes. Respiratory Not Present- Bloody sputum, Chronic Cough, Difficulty Breathing, Snoring and Wheezing. Breast Present- Breast Mass. Not Present- Breast Pain, Nipple Discharge and Skin Changes. Cardiovascular Not Present- Chest Pain, Difficulty Breathing Lying Down, Leg Cramps, Palpitations, Rapid Heart Rate, Shortness of Breath and Swelling of Extremities. Gastrointestinal Not Present- Abdominal Pain, Bloating, Bloody Stool, Change in Bowel Habits, Chronic diarrhea, Constipation, Difficulty Swallowing, Excessive gas, Gets full quickly at meals, Hemorrhoids, Indigestion, Nausea, Rectal Pain and Vomiting. Female Genitourinary Not Present- Frequency, Nocturia, Painful Urination, Pelvic Pain and Urgency. Musculoskeletal Not Present- Back Pain, Joint Pain, Joint Stiffness, Muscle Pain, Muscle Weakness and Swelling of  Extremities. Neurological Not Present- Decreased Memory, Fainting, Headaches, Numbness, Seizures, Tingling, Tremor, Trouble walking and Weakness. Psychiatric Not  Present- Anxiety, Bipolar, Change in Sleep Pattern, Depression, Fearful and Frequent crying. Endocrine Present- Hair Changes. Not Present- Cold Intolerance, Excessive Hunger, Heat Intolerance, Hot flashes and New Diabetes. Hematology Present- Blood Thinners. Not Present- Easy Bruising, Excessive bleeding, Gland problems, HIV and Persistent Infections.  Vitals (Alisha Spillers CMA; 04/04/2018 10:14 AM) 04/04/2018 10:13 AM Weight: 183 lb Height: 65in Body Surface Area: 1.9 m Body Mass Index: 30.45 kg/m  Pulse: 108 (Regular)  BP: 142/74 (Sitting, Left Arm, Standard)       Physical Exam Rolm Bookbinder MD; 04/04/2018 10:42 AM) General Mental Status-Alert.  Head and Neck Trachea-midline. Thyroid Gland Characteristics - normal size and consistency.  Eye Sclera/Conjunctiva - Bilateral-No scleral icterus.  Chest and Lung Exam Chest and lung exam reveals -quiet, even and easy respiratory effort with no use of accessory muscles and on auscultation, normal breath sounds, no adventitious sounds and normal vocal resonance.  Breast Nipples-No Discharge. Note: 4 cm upper outer quadrant/lower axillary breast mass    Cardiovascular Cardiovascular examination reveals -normal heart sounds, regular rate and rhythm with no murmurs.  Abdomen Note: soft no hm   Neurologic Neurologic evaluation reveals -alert and oriented x 3 with no impairment of recent or remote memory.  Neuropsychiatric Mental status exam performed with findings of-Oriented X3 with appropriate mood and affect.  Lymphatic Head & Neck  General Head & Neck Lymphatics: Bilateral - Description - Normal. Axillary  General Axillary Region: Bilateral - Description - Normal. Note: no Morley adenopathy     Assessment & Plan Rolm Bookbinder MD; 04/04/2018 1:34 PM) BREAST CANCER OF UPPER-OUTER QUADRANT OF LEFT FEMALE BREAST (C50.412) Left MRM port removal

## 2018-04-19 NOTE — Op Note (Signed)
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  Left modified radical mastectomy 2.  Port removal Surgeon: Dr. Serita Grammes Anesthesia: General with pectoral block Estimated blood loss: 50 cc Drains: 219 French Blake drains Specimens: 1.  Left modified radical mastectomy marked short superior, long lateral 2.  Port Complications: None Sponge needle count was correct at completion Decision recovery stable condition  Indications: This is a 67 year old female who has been receiving chemotherapy at an outside hospital.  She had a very large triple negative upper outer quadrant tumor with multiple positive nodes at the outset.  She is completed chemotherapy.  I saw her after that and obtain an MRI.  This shows a 13 cm tumor with at least 9 abnormal nodes after chemotherapy.  On exam the tumor is growing into the skin of the axilla.  She also has had a DVT associated with a right subclavian port.  I discussed with her a left modified radical mastectomy and port removal.  Procedure: After informed consent was obtained the patient first underwent a pectoral block.  She was given antibiotics.  SCDs were in place.  She was placed under general anesthesia without complication.  She was prepped and draped in the standard sterile surgical fashion.  Surgical timeout was then performed.  I infiltrated Marcaine overlying her port.  I reentered her old incision.  I remove the port as well as the line in their entirety.  I then closed the incision with 3-0 Vicryl, 4 Monocryl, and glue.  I then made a very large elliptical incision that went from the parasternal area all the way into the axilla to remove all of the skin overlying this very large tumor.  I then created flaps to the clavicle, parasternal area, inframammary fold, and latissimus laterally.  I resected all of the skin that appeared to  be involved with tumor.  I then remove the breast in the pectoralis fashion from the pectoralis muscle and rolled this laterally.  I entered into the axilla.  She had a number of very large draining veins with very large number of abnormal lymph nodes as well.  Identified the axillary vein, long thoracic nerve, and thoracic thoracodorsal bundle.  I removed some of this as I swept it caudally from the vein.  These nodes were very adherent to both the thoracodorsal bundle and part of the axillary vein.  I carefully dissected these off both of those structures and left clips in place.  I removed all of the palpable nodes even up some that were underneath the pectoralis muscle on the chest wall.  This was then all passed off the table of the specimen after being marked.  Hemostasis was observed.  I did place some Arista in the axilla.  I placed 219 [place] Blake drains and secured this with 2-0 nylon sutures.  Biopatch and a dressing was placed over this.  I then closed the incision with 3-0 Vicryl and 4-0 Monocryl.  Glue and Steri-Strips were placed.  She tolerated this well was extubated and transferred to the recovery room in stable condition.

## 2018-04-19 NOTE — Anesthesia Postprocedure Evaluation (Signed)
Anesthesia Post Note  Patient: [employer]  Procedure(s) Performed: LEFT MODIFIED RADICAL MASTECTOMY (Left Breast) REMOVAL PORT-A-CATH (Right Chest)     Patient location during evaluation: PACU Anesthesia Type: General Level of consciousness: awake and alert Pain management: pain level controlled Vital Signs Assessment: post-procedure vital signs reviewed and stable Respiratory status: spontaneous breathing, nonlabored ventilation, respiratory function stable and patient connected to nasal cannula oxygen Cardiovascular status: blood pressure returned to baseline and stable Postop Assessment: no apparent nausea or vomiting Anesthetic complications: no    Last Vitals:  Vitals:   04/19/18 1140 04/19/18 1229  BP: 106/63 118/69  Pulse: 65 66  Resp: 14 14  Temp:  36.4 C  SpO2: 100% 97%    Last Pain:  Vitals:   04/19/18 1229  TempSrc: Oral  PainSc:                  Jaidalyn Schillo DAVID

## 2018-04-19 NOTE — Interval H&P Note (Signed)
History and Physical Interval Note:  04/19/2018 8:32 AM  Brenda Richmond  has presented today for surgery, with the diagnosis of LEFT BREAST CANCER  The various methods of treatment have been discussed with the patient and family. After consideration of risks, benefits and other options for treatment, the patient has consented to  Procedure(s): LEFT MODIFIED RADICAL MASTECTOMY (Left) REMOVAL PORT-A-CATH (N/A) as a surgical intervention .  The patient's history has been reviewed, patient examined, no change in status, stable for surgery.  I have reviewed the patient's chart and labs.  Questions were answered to the patient's satisfaction.     Rolm Bookbinder

## 2018-04-19 NOTE — Anesthesia Procedure Notes (Signed)
Anesthesia Regional Block: Pectoralis block   Pre-Anesthetic Checklist: ,, timeout performed, Correct Patient, Correct Site, Correct Laterality, Correct Procedure, Correct Position, site marked, Risks and benefits discussed,  Surgical consent,  Pre-op evaluation,  At surgeon's request and post-op pain management  Laterality: Left  Prep: chloraprep       Needles:  Injection technique: Single-shot     Needle Length: 9cm  Needle Gauge: 21     Additional Needles:   Narrative:  Start time: 04/19/2018 8:45 AM End time: 04/19/2018 8:55 AM Injection made incrementally with aspirations every 5 mL.  Performed by: Personally  Anesthesiologist: Lillia Abed, MD  Additional Notes: Monitors applied. Patient sedated. Sterile prep and drape,hand hygiene and sterile gloves were used. Relevant anatomy identified.Needle position confirmed.Local anesthetic injected incrementally after negative aspiration. Local anesthetic spread visualized. Vascular puncture avoided. No complications. Image printed for medical record.The patient tolerated the procedure well.

## 2018-04-19 NOTE — Anesthesia Preprocedure Evaluation (Addendum)
Anesthesia Evaluation  Patient identified by MRN, date of birth, ID band  Reviewed: Allergy & Precautions, NPO status , Patient's Chart, lab work & pertinent test results  Airway Mallampati: II  TM Distance: >3 FB Neck ROM: Full    Dental  (+) Missing, Dental Advisory Given, Teeth Intact   Pulmonary    Pulmonary exam normal        Cardiovascular hypertension, Pt. on medications Normal cardiovascular exam     Neuro/Psych    GI/Hepatic GERD  Medicated and Controlled,  Endo/Other    Renal/GU      Musculoskeletal   Abdominal   Peds  Hematology   Anesthesia Other Findings   Reproductive/Obstetrics                            Anesthesia Physical Anesthesia Plan  ASA: III  Anesthesia Plan: General   Post-op Pain Management:    Induction: Intravenous  PONV Risk Score and Plan: 3 and Ondansetron, Scopolamine patch - Pre-op and Dexamethasone  Airway Management Planned: Oral ETT  Additional Equipment:   Intra-op Plan:   Post-operative Plan: Extubation in OR  Informed Consent: I have reviewed the patients History and Physical, chart, labs and discussed the procedure including the risks, benefits and alternatives for the proposed anesthesia with the patient or authorized representative who has indicated his/her understanding and acceptance.   Dental advisory given  Plan Discussed with: CRNA and Surgeon  Anesthesia Plan Comments:        Anesthesia Quick Evaluation

## 2018-04-19 NOTE — Anesthesia Procedure Notes (Addendum)
Procedure Name: Intubation Date/Time: 04/19/2018 9:15 AM Performed by: Harden Mo, CRNA Pre-anesthesia Checklist: Patient identified, Emergency Drugs available, Suction available and Patient being monitored Patient Re-evaluated:Patient Re-evaluated prior to induction Oxygen Delivery Method: Circle System Utilized Preoxygenation: Pre-oxygenation with 100% oxygen Induction Type: IV induction Ventilation: Mask ventilation without difficulty Laryngoscope Size: Miller, 2, Mac and 4 Grade View: Grade III Tube type: Oral Tube size: 7.5 mm Number of attempts: 2 Airway Equipment and Method: Stylet and Oral airway Placement Confirmation: ETT inserted through vocal cords under direct vision,  positive ETCO2 and breath sounds checked- equal and bilateral Secured at: 22 cm Tube secured with: Tape Dental Injury: Teeth and Oropharynx as per pre-operative assessment  Comments: DL x 1 by CRNA with Sabra Heck 2 to obtain grade 3 view. Patient with anterior larynx. DL x 1 by Conrad Weston, MD with Mac 4 to obtain similar view. Atraumatic intubation.

## 2018-04-19 NOTE — Transfer of Care (Signed)
Immediate Anesthesia Transfer of Care Note  Patient: Brenda Richmond  Procedure(s) Performed: LEFT MODIFIED RADICAL MASTECTOMY (Left Breast) REMOVAL PORT-A-CATH (Right Chest)  Patient Location: PACU  Anesthesia Type:General and Regional  Level of Consciousness: awake, alert  and oriented  Airway & Oxygen Therapy: Patient Spontanous Breathing and Patient connected to nasal cannula oxygen  Post-op Assessment: Report given to RN, Post -op Vital signs reviewed and stable and Patient moving all extremities X 4  Post vital signs: Reviewed and stable  Last Vitals:  Vitals Value Taken Time  BP 108/71 04/19/2018 11:07 AM  Temp    Pulse 70 04/19/2018 11:09 AM  Resp 12 04/19/2018 11:09 AM  SpO2 98 % 04/19/2018 11:09 AM  Vitals shown include unvalidated device data.  Last Pain:  Vitals:   04/19/18 0744  TempSrc:   PainSc: 0-No pain      Patients Stated Pain Goal: 2 (62/22/97 [id number])  Complications: No apparent anesthesia complications

## 2018-04-20 ENCOUNTER — Encounter (HOSPITAL_COMMUNITY): Payer: Self-pay | Admitting: General Surgery

## 2018-04-20 DIAGNOSIS — C50412 Malignant neoplasm of upper-outer quadrant of left female breast: Secondary | ICD-10-CM | POA: Diagnosis not present

## 2018-04-20 LAB — BASIC METABOLIC PANEL
Anion gap: 8 (ref 5–15)
CO2: 28 mmol/L (ref 22–32)
CREATININE: 0.75 mg/dL (ref 0.44–1.00)
Calcium: 9.4 mg/dL (ref 8.9–10.3)
Chloride: 102 mmol/L (ref 98–111)
GFR calc Af Amer: >60 mL/min (ref >60)
GLUCOSE: 109 mg/dL — AB (ref 70–99)
POTASSIUM: 3.4 mmol/L — AB (ref 3.5–5.1)
Sodium: 138 mmol/L (ref 135–145)

## 2018-04-20 LAB — CBC
HEMATOCRIT: 33.1 % — AB (ref 36.0–46.0)
Hemoglobin: 10.4 g/dL — ABNORMAL LOW (ref 12.0–15.0)
MCH: 27.9 pg (ref 26.0–34.0)
MCHC: 31.4 g/dL (ref 30.0–36.0)
MCV: 88.7 fL (ref 78.0–100.0)
Platelets: 207 10*3/uL (ref 150–400)
RBC: 3.73 MIL/uL — ABNORMAL LOW (ref 3.87–5.11)
RDW: 14.3 % (ref 11.5–15.5)
WBC: 5.9 10*3/uL (ref 4.0–10.5)

## 2018-04-20 MED ORDER — OXYCODONE HCL 5 MG PO TABS: 5.0000 mg | ORAL_TABLET | ORAL | 0 refills | Status: DC | PRN

## 2018-04-20 NOTE — Discharge Summary (Signed)
Physician Discharge Summary  Patient ID: Brenda Richmond MRN: [id number] DOB/AGE: [birth year] 66 y.o.  Admit date: 04/19/2018 Discharge date: 04/20/2018  Admission Diagnoses: Stage 3 left breast cancer dvt  Discharge Diagnoses:  Active Problems:   Breast cancer, stage 3, left ([place])   Discharged Condition: good  Hospital Course: 22 yof s/p primary chemotherapy with poor response. She underwent left mrm and port removal. Doing great following morning and will be discharged home  Consults: None  Significant Diagnostic Studies: none  Treatments: surgery: left mrm, port removal  Discharge Exam: Blood pressure 126/72, pulse 75, temperature 98.1 F (36.7 C), temperature source Oral, resp. rate 18, weight 83.5 kg, SpO2 97 %. left mastectomy incision healing no infection no hematoma drain as expected  Disposition: Discharge disposition: 01-Home or Self Care        Allergies as of 04/20/2018      Reactions   Penicillin G Rash, Other (See Comments)   PATIENT HAS HAD A PCN REACTION WITH IMMEDIATE RASH, FACIAL/TONGUE/THROAT SWELLING, SOB, OR LIGHTHEADEDNESS WITH HYPOTENSION:  #  #  YES  #  #  Has patient had a PCN reaction causing severe rash involving mucus membranes or skin necrosis: No PATIENT HAS HAD A PCN REACTION THAT REQUIRED HOSPITALIZATION:  #  #  YES  #  #  Has patient had a PCN reaction occurring within the last 10 years: No   Latex Rash      Medication List    TAKE these medications   amLODipine 5 MG tablet Commonly known as:  NORVASC Take 5 mg by mouth at bedtime.   apixaban 2.5 MG Tabs tablet Commonly known as:  ELIQUIS Take 1 tablet (2.5 mg total) by mouth 2 (two) times daily.   CALCIUM 1200+D3 PO Take 2 tablets by mouth daily with lunch.   fexofenadine 180 MG tablet Commonly known as:  ALLEGRA Take 180 mg by mouth daily as needed for allergies or rhinitis.   hydrochlorothiazide 25 MG tablet Commonly known as:  HYDRODIURIL Take 25 mg by mouth daily.    KLOR-CON M20 20 MEQ tablet Generic drug:  potassium chloride SA Take 20 mEq by mouth daily.   multivitamin with minerals Tabs tablet Take 1 tablet by mouth daily with lunch.   oxyCODONE 5 MG immediate release tablet Commonly known as:  Oxy IR/ROXICODONE Take 1 tablet (5 mg total) by mouth every 4 (four) hours as needed for moderate pain.   polyethylene glycol packet Commonly known as:  MIRALAX / GLYCOLAX Take 17 g by mouth daily as needed for moderate constipation.   ramipril 5 MG capsule Commonly known as:  ALTACE Take 5 mg by mouth daily.   Vitamin D (Ergocalciferol) 50000 units Caps capsule Commonly known as:  DRISDOL Take 50,000 Units by mouth every Monday.      Follow-up Information    Rolm Bookbinder, MD In 2 weeks.   Specialty:  General Surgery Contact information: [address] [place] [address] [phone]           Signed: Rolm Bookbinder 04/20/2018, 7:47 AM

## 2018-04-20 NOTE — Discharge Instructions (Signed)
[facility], [place] [phone]  MASTECTOMY: POST OP INSTRUCTIONS Take 650 mg tylenol every 6 hours for next 72 hours.  May shower in 48 hours. Use oxycodone as needed Always review your discharge instruction sheet given to you by the facility where your surgery was performed. IF YOU HAVE DISABILITY OR FAMILY LEAVE FORMS, YOU MUST BRING THEM TO THE OFFICE FOR PROCESSING.   DO NOT GIVE THEM TO YOUR DOCTOR. A prescription for pain medication may be given to you upon discharge.  Take your pain medication as prescribed, if needed.  If narcotic pain medicine is not needed, then you may take acetaminophen (Tylenol), naprosyn (Alleve) or ibuprofen (Advil) as needed. 1. Take your usually prescribed medications unless otherwise directed. 2. If you need a refill on your pain medication, please contact your pharmacy.  They will contact our office to request authorization.  Prescriptions will not be filled after 5pm or on week-ends. 3. You should follow a light diet the first few days after arrival home, such as soup and crackers, etc.  Resume your normal diet the day after surgery. 4. Most patients will experience some swelling and bruising on the chest and underarm.  Ice packs will help.  Swelling and bruising can take several days to resolve. Wear the binder day and night until you return to the office.  5. It is common to experience some constipation if taking pain medication after surgery.  Increasing fluid intake and taking a stool softener (such as Colace) will usually help or prevent this problem from occurring.  A mild laxative (Milk of Magnesia or Miralax) should be taken according to package instructions if there are no bowel movements after 48 hours. 6. Unless discharge instructions indicate otherwise, leave your bandage dry and in place until your next appointment in 3-5 days.  You may take a limited sponge bath.  No tube baths or showers until the drains are removed.  You may have  steri-strips (small skin tapes) in place directly over the incision.  These strips should be left on the skin for 7-10 days. If you have glue it will come off in next couple week.  Any sutures will be removed at an office visit 7. DRAINS:  If you have drains in place, it is important to keep a list of the amount of drainage produced each day in your drains.  Before leaving the hospital, you should be instructed on drain care.  Call our office if you have any questions about your drains. I will remove your drains when they put out less than 30 cc or ml for 2 consecutive days. 8. ACTIVITIES:  You may resume regular (light) daily activities beginning the next day--such as daily self-care, walking, climbing stairs--gradually increasing activities as tolerated.  You may have sexual intercourse when it is comfortable.  Refrain from any heavy lifting or straining until approved by your doctor. a. You may drive when you are no longer taking prescription pain medication, you can comfortably wear a seatbelt, and you can safely maneuver your car and apply brakes. b. RETURN TO WORK:  __________________________________________________________ 9. You should see your doctor in the office for a follow-up appointment approximately 3-5 days after your surgery.  Your doctors nurse will typically make your follow-up appointment when she calls you with your pathology report.  Expect your pathology report 3-4business days after surgery. 10. OTHER INSTRUCTIONS: ______________________________________________________________________________________________ ____________________________________________________________________________________________ WHEN TO CALL YOUR DR Brenda Richmond: 1. Fever over 101.0 2. Nausea and/or vomiting 3. Extreme swelling or  bruising 4. Continued bleeding from incision. 5. Increased pain, redness, or drainage from the incision. The clinic staff is available to answer your questions during regular business  hours.  Please dont hesitate to call and ask to speak to one of the nurses for clinical concerns.  If you have a medical emergency, go to the nearest emergency room or call 911.  A surgeon from [facility] is always on call at the hospital. [address] [place], [place], [place]  [address] ? [address], [place], [place]   [address] [phone] ? [phone] ? FAX [phone] Web site: [email or web address]

## 2018-04-20 NOTE — Progress Notes (Signed)
Discharge home. Home discharge instruction given, no question verbalized. 

## 2018-05-02 ENCOUNTER — Other Ambulatory Visit: Payer: Self-pay | Admitting: Oncology

## 2018-05-10 ENCOUNTER — Ambulatory Visit (HOSPITAL_COMMUNITY): Payer: Managed Care, Other (non HMO) | Admitting: Hematology

## 2018-05-10 ENCOUNTER — Other Ambulatory Visit (HOSPITAL_COMMUNITY): Payer: Managed Care, Other (non HMO)

## 2018-05-17 ENCOUNTER — Inpatient Hospital Stay (HOSPITAL_COMMUNITY): Payer: Managed Care, Other (non HMO) | Attending: Hematology

## 2018-05-17 ENCOUNTER — Other Ambulatory Visit: Payer: Self-pay

## 2018-05-17 ENCOUNTER — Inpatient Hospital Stay (HOSPITAL_BASED_OUTPATIENT_CLINIC_OR_DEPARTMENT_OTHER): Payer: Managed Care, Other (non HMO) | Admitting: Hematology

## 2018-05-17 ENCOUNTER — Encounter (HOSPITAL_COMMUNITY): Payer: Self-pay | Admitting: Hematology

## 2018-05-17 VITALS — BP 146/77 | HR 76 | Temp 98.7°F | Resp 16 | Wt 179.2 lb

## 2018-05-17 DIAGNOSIS — Z9221 Personal history of antineoplastic chemotherapy: Secondary | ICD-10-CM | POA: Insufficient documentation

## 2018-05-17 DIAGNOSIS — Z9012 Acquired absence of left breast and nipple: Secondary | ICD-10-CM | POA: Insufficient documentation

## 2018-05-17 DIAGNOSIS — Z803 Family history of malignant neoplasm of breast: Secondary | ICD-10-CM

## 2018-05-17 DIAGNOSIS — Z8042 Family history of malignant neoplasm of prostate: Secondary | ICD-10-CM

## 2018-05-17 DIAGNOSIS — Z171 Estrogen receptor negative status [ER-]: Secondary | ICD-10-CM | POA: Diagnosis not present

## 2018-05-17 DIAGNOSIS — Z86718 Personal history of other venous thrombosis and embolism: Secondary | ICD-10-CM | POA: Insufficient documentation

## 2018-05-17 DIAGNOSIS — C50412 Malignant neoplasm of upper-outer quadrant of left female breast: Secondary | ICD-10-CM

## 2018-05-17 DIAGNOSIS — G629 Polyneuropathy, unspecified: Secondary | ICD-10-CM | POA: Insufficient documentation

## 2018-05-17 LAB — CBC WITH DIFFERENTIAL/PLATELET
BASOS PCT: 0 %
Basophils Absolute: 0 10*3/uL (ref 0.0–0.1)
EOS ABS: 0.1 10*3/uL (ref 0.0–0.7)
Eosinophils Relative: 4 %
HCT: 39 % (ref 36.0–46.0)
HEMOGLOBIN: 12.4 g/dL (ref 12.0–15.0)
Lymphocytes Relative: 28 %
Lymphs Abs: 0.8 10*3/uL (ref 0.7–4.0)
MCH: 27.9 pg (ref 26.0–34.0)
MCHC: 31.8 g/dL (ref 30.0–36.0)
MCV: 87.8 fL (ref 78.0–100.0)
MONO ABS: 0.4 10*3/uL (ref 0.1–1.0)
MONOS PCT: 13 %
Neutro Abs: 1.5 10*3/uL — ABNORMAL LOW (ref 1.7–7.7)
Neutrophils Relative %: 55 %
Platelets: 233 10*3/uL (ref 150–400)
RBC: 4.44 MIL/uL (ref 3.87–5.11)
RDW: 13.5 % (ref 11.5–15.5)
WBC: 2.7 10*3/uL — ABNORMAL LOW (ref 4.0–10.5)

## 2018-05-17 LAB — COMPREHENSIVE METABOLIC PANEL
ALBUMIN: 4.2 g/dL (ref 3.5–5.0)
ALK PHOS: 68 U/L (ref 38–126)
ALT: 20 U/L (ref 0–44)
ANION GAP: 7 (ref 5–15)
AST: 20 U/L (ref 15–41)
BILIRUBIN TOTAL: 1.1 mg/dL (ref 0.3–1.2)
BUN: 13 mg/dL (ref 8–23)
CHLORIDE: 103 mmol/L (ref 98–111)
CO2: 30 mmol/L (ref 22–32)
Calcium: 10.3 mg/dL (ref 8.9–10.3)
Creatinine, Ser: 0.63 mg/dL (ref 0.44–1.00)
GFR calc Af Amer: >60 mL/min (ref >60)
GFR calc non Af Amer: >60 mL/min (ref >60)
GLUCOSE: 95 mg/dL (ref 70–99)
POTASSIUM: 4.2 mmol/L (ref 3.5–5.1)
SODIUM: 140 mmol/L (ref 135–145)
Total Protein: 7.3 g/dL (ref 6.5–8.1)

## 2018-05-17 NOTE — Progress Notes (Signed)
[facility] [place], [place] [address]   CLINIC:  Medical Oncology/Hematology  PCP:  Eber Hong, MD [id number] [address] [phone]   REASON FOR VISIT:  Follow-up for breast cancer  CURRENT THERAPY: S/P left mastectomy starting xeloda   BRIEF ONCOLOGIC HISTORY:    Malignant neoplasm of upper-outer quadrant of left breast in female, estrogen receptor negative ([place])   09/26/2017 Initial Biopsy    Biopsy of the left breast and left axillary lymph node consistent with high-grade TNBC, Ki-67 of 80%  Clinical stage T1cN2 M0    09/26/2017 Family History    Sister had breast cancer at age 55, HER-2 positive, multiple paternal uncles had prostate cancer, father had prostate cancer, 2 paternal cousins had breast cancer     10/11/2017 Echocardiogram    Echocardiogram on 10/11/2017 showed ejection fraction of 65-70%    10/18/2017 Imaging    CT scan of the chest, abdomen and pelvis shows tiny circumscribed noncalcified nodule in the posterior segment of the left upper lobe of the lung with no other evidence of metastatic disease  MRI of the abdomen with and without contrast ordered for elevated total bilirubin on 10/29/2017 shows no evidence of liver metastasis    10/22/2017 Genetic Testing    [employer] myRisk test for BRCA 1 and 2 and other mutations negative    10/23/2017 -  Chemotherapy    The patient had DOXOrubicin (ADRIAMYCIN) chemo injection 114 mg, 60 mg/m2, Intravenous,  Once, 4 of 4 cycles Administration: 114 mg (12/05/2017) palonosetron (ALOXI) injection 0.25 mg, 0.25 mg, Intravenous,  Once, 8 of 8 cycles Administration: 0.25 mg (12/20/2017), 0.25 mg (12/05/2017), 0.25 mg (01/17/2018), 0.25 mg (12/27/2017), 0.25 mg (01/10/2018), 0.25 mg (01/25/2018), 0.25 mg (02/15/2018), 0.25 mg (02/22/2018), 0.25 mg (03/01/2018), 0.25 mg (03/15/2018), 0.25 mg (03/22/2018), 0.25 mg (02/08/2018), 0.25 mg (03/08/2018) pegfilgrastim (NEULASTA ONPRO KIT) injection 6 mg,  6 mg, Subcutaneous, Once, 1 of 1 cycle Administration: 6 mg (12/05/2017) filgrastim (NEUPOGEN) injection 480 mcg, 480 mcg, Subcutaneous, Once PRN, 1 of 1 cycle pegfilgrastim-cbqv (UDENYCA) injection 6 mg, 6 mg, Subcutaneous, Once, 3 of 3 cycles CARBOplatin (PARAPLATIN) 230 mg in sodium chloride 0.9 % 250 mL chemo infusion, 230 mg (100 % of original dose 230.8 mg), Intravenous,  Once, 4 of 4 cycles Dose modification:   (original dose 230.8 mg, Cycle 5), 100 mg (original dose 100 mg, Cycle 6),   (original dose 230.8 mg, Cycle 5), 184.64 mg (80 % of original dose 230.8 mg, Cycle 5, Reason: Provider Judgment), 150 mg (original dose 100 mg, Cycle 7, Reason: Provider Judgment),   (original dose 100 mg, Cycle 7, Reason: Provider Judgment), 150 mg (original dose 100 mg, Cycle 7, Reason: Provider Judgment), 150 mg (original dose 100 mg, Cycle 8, Reason: Provider Judgment),   (original dose 100 mg, Cycle 8, Reason: Provider Judgment), 150 mg (original dose 100 mg, Cycle 6, Reason: Provider Judgment) Administration: 230 mg (12/20/2017), 100 mg (01/17/2018), 180 mg (12/27/2017), 150 mg (02/15/2018), 200 mg (02/22/2018), 150 mg (03/01/2018), 150 mg (03/15/2018), 200 mg (03/22/2018), 150 mg (02/08/2018), 100 mg (03/08/2018) cyclophosphamide (CYTOXAN) 1,140 mg in sodium chloride 0.9 % 250 mL chemo infusion, 600 mg/m2, Intravenous,  Once, 4 of 4 cycles Administration: 1,140 mg (12/05/2017) PACLitaxel (TAXOL) 150 mg in dextrose 5 % 250 mL chemo infusion (</= 26m/m2), 80 mg/m2 = 150 mg, Intravenous,  Once, 4 of 4 cycles Administration: 150 mg (12/20/2017), 150 mg (12/27/2017), 150 mg (01/17/2018), 150 mg (01/10/2018), 150  mg (03/15/2018), 150 mg (01/25/2018), 150 mg (02/15/2018), 150 mg (02/22/2018), 150 mg (03/01/2018), 150 mg (03/22/2018), 150 mg (02/08/2018), 150 mg (03/08/2018) fosaprepitant (EMEND) 150 mg, dexamethasone (DECADRON) 12 mg in sodium chloride 0.9 % 145 mL IVPB, , Intravenous,  Once, 5 of 5 cycles Administration:  (12/20/2017),   (12/05/2017)  for chemotherapy treatment.     10/24/2017 -  Neo-Adjuvant Chemotherapy    Dose dense AC for 4 cycles followed by weekly carboplatin and paclitaxel for 12 weeks      CANCER STAGING: Cancer Staging Malignant neoplasm of upper-outer quadrant of left breast in female, estrogen receptor negative ([place]) Staging form: Breast, AJCC 8th Edition - Clinical: Stage IIIC (cT1, cN2, cM0, G3, ER: Negative, PR: Negative, HER2: Negative) - Signed by Holley Bouche, NP on 11/28/2017    INTERVAL HISTORY:  Ms. Zorn 67 y.o. female returns for routine follow-up for breast cancer. She recently had a left mastectomy. She is recovering well. Her incision is healing nice and there is no redness or swelling at the site. She had slight swelling in her left arm and she is wearing a compression sleeve to help with the edema. She reports her appetite is 100% and she is maintaining her weight well. Her energy level is almost 100%. It is improving. She denies any new pains or lumps found. Denies any nausea, vomiting, or diarrhea. Denies any fevers or recent infection.    REVIEW OF SYSTEMS:  Review of Systems  Gastrointestinal: Positive for constipation.  All other systems reviewed and are negative.    PAST MEDICAL/SURGICAL HISTORY:  Past Medical History:  Diagnosis Date  . Breast cancer, left breast ([place]) 2019  . H/O seasonal allergies   . Heart murmur   . High cholesterol   . Hypertension   . Melanoma ([place])    left posterior forearm  . PONV (postoperative nausea and vomiting)   . Thrombosis    in right arm developed after 2nd round of chemotherapy   Past Surgical History:  Procedure Laterality Date  . BREAST BIOPSY Left 09/2017  . BREAST CYST EXCISION Left ~ 2004  . BUNIONECTOMY Bilateral   . MASTECTOMY MODIFIED RADICAL Left 04/19/2018  . MASTECTOMY MODIFIED RADICAL Left 04/19/2018   Procedure: LEFT MODIFIED RADICAL MASTECTOMY;  Surgeon: Rolm Bookbinder, MD;  Location: [facility];   Service: General;  Laterality: Left;  Marland Kitchen MELANOMA EXCISION Left    posterior forearm  . PORT-A-CATH REMOVAL Right 04/19/2018   Procedure: REMOVAL PORT-A-CATH;  Surgeon: Rolm Bookbinder, MD;  Location: [facility];  Service: General;  Laterality: Right;  . PORTA CATH INSERTION Right   . PORTA CATH REMOVAL Right 04/19/2018  . TUBAL LIGATION       SOCIAL HISTORY:  Social History   Socioeconomic History  . Marital status: Divorced    Spouse name: Not on file  . Number of children: Not on file  . Years of education: Not on file  . Highest education level: Not on file  Occupational History  . Not on file  Social Needs  . Financial resource strain: Not on file  . Food insecurity:    Worry: Not on file    Inability: Not on file  . Transportation needs:    Medical: Not on file    Non-medical: Not on file  Tobacco Use  . Smoking status: Never Smoker  . Smokeless tobacco: Never Used  Substance and Sexual Activity  . Alcohol use: Never    Frequency: Never  . Drug use: Never  .  Sexual activity: Not on file  Lifestyle  . Physical activity:    Days per week: Not on file    Minutes per session: Not on file  . Stress: Not on file  Relationships  . Social connections:    Talks on phone: Not on file    Gets together: Not on file    Attends religious service: Not on file    Active member of club or organization: Not on file    Attends meetings of clubs or organizations: Not on file    Relationship status: Not on file  . Intimate partner violence:    Fear of current or ex partner: Not on file    Emotionally abused: Not on file    Physically abused: Not on file    Forced sexual activity: Not on file  Other Topics Concern  . Not on file  Social History Narrative  . Not on file    FAMILY HISTORY:  History reviewed. No pertinent family history.  CURRENT MEDICATIONS:  Outpatient Encounter Medications as of 05/17/2018  Medication Sig  . amLODipine (NORVASC) 5 MG tablet Take 5 mg by  mouth at bedtime.   Marland Kitchen atorvastatin (LIPITOR) 10 MG tablet   . Calcium-Magnesium-Vitamin D (CALCIUM 1200+D3 PO) Take 2 tablets by mouth daily with lunch.  . fexofenadine (ALLEGRA) 180 MG tablet Take 180 mg by mouth daily as needed for allergies or rhinitis.  . hydrochlorothiazide (HYDRODIURIL) 25 MG tablet Take 25 mg by mouth daily.   Marland Kitchen KLOR-CON M20 20 MEQ tablet Take 20 mEq by mouth daily.  . Multiple Vitamin (MULTIVITAMIN WITH MINERALS) TABS tablet Take 1 tablet by mouth daily with lunch.  . polyethylene glycol (MIRALAX / GLYCOLAX) packet Take 17 g by mouth daily as needed for moderate constipation.  . ramipril (ALTACE) 5 MG capsule Take 5 mg by mouth daily.   . Vitamin D, Ergocalciferol, (DRISDOL) 50000 units CAPS capsule Take 50,000 Units by mouth every Monday.   . [DISCONTINUED] apixaban (ELIQUIS) 2.5 MG TABS tablet Take 1 tablet (2.5 mg total) by mouth 2 (two) times daily.  . [DISCONTINUED] oxyCODONE (OXY IR/ROXICODONE) 5 MG immediate release tablet Take 1 tablet (5 mg total) by mouth every 4 (four) hours as needed for moderate pain.   No facility-administered encounter medications on file as of 05/17/2018.     ALLERGIES:  Allergies  Allergen Reactions  . Penicillin G Rash and Other (See Comments)    PATIENT HAS HAD A PCN REACTION WITH IMMEDIATE RASH, FACIAL/TONGUE/THROAT SWELLING, SOB, OR LIGHTHEADEDNESS WITH HYPOTENSION:  #  #  YES  #  #  Has patient had a PCN reaction causing severe rash involving mucus membranes or skin necrosis: No PATIENT HAS HAD A PCN REACTION THAT REQUIRED HOSPITALIZATION:  #  #  YES  #  #  Has patient had a PCN reaction occurring within the last 10 years: No   . Latex Rash     PHYSICAL EXAM:  ECOG Performance status: 1  Vitals:   05/17/18 1030  BP: (!) 146/77  Pulse: 76  Resp: 16  Temp: 98.7 F (37.1 C)  SpO2: 100%   Filed Weights   05/17/18 1030  Weight: 179 lb 3.2 oz (81.3 kg)    Physical Exam  Constitutional: She is oriented to person,  place, and time. She appears well-developed and well-nourished.  Neck: Normal range of motion. Neck supple.  Cardiovascular: Normal rate, regular rhythm and normal heart sounds.  Pulmonary/Chest: Effort normal and breath sounds normal.  Abdominal: Soft.  Musculoskeletal: Normal range of motion.  Neurological: She is alert and oriented to person, place, and time.  Skin: Skin is warm and dry.  Psychiatric: She has a normal mood and affect. Her behavior is normal. Judgment and thought content normal.  Left mastectomy site is well-healed.  No palpable supraclavicular or axillary adenopathy.  No palpable chest nodules.   LABORATORY DATA:  I have reviewed the labs as listed.  CBC    Component Value Date/Time   WBC 2.7 (L) 05/17/2018 0933   RBC 4.44 05/17/2018 0933   HGB 12.4 05/17/2018 0933   HGB 11.6 03/20/2018 1313   HCT 39.0 05/17/2018 0933   PLT 233 05/17/2018 0933   PLT 202 03/20/2018 1313   MCV 87.8 05/17/2018 0933   MCH 27.9 05/17/2018 0933   MCHC 31.8 05/17/2018 0933   RDW 13.5 05/17/2018 0933   LYMPHSABS 0.8 05/17/2018 0933   MONOABS 0.4 05/17/2018 0933   EOSABS 0.1 05/17/2018 0933   BASOSABS 0.0 05/17/2018 0933   CMP Latest Ref Rng & Units 05/17/2018 04/20/2018 04/15/2018  Glucose 70 - 99 mg/dL 95 109(H) 101(H)  BUN 8 - 23 mg/dL 13 <5(L) 11  Creatinine 0.44 - 1.00 mg/dL 0.63 0.75 0.71  Sodium 135 - 145 mmol/L 140 138 140  Potassium 3.5 - 5.1 mmol/L 4.2 3.4(L) 4.1  Chloride 98 - 111 mmol/L 103 102 100  CO2 22 - 32 mmol/L 30 28 29   Calcium 8.9 - 10.3 mg/dL 10.3 9.4 10.3  Total Protein 6.5 - 8.1 g/dL 7.3 - -  Total Bilirubin 0.3 - 1.2 mg/dL 1.1 - -  Alkaline Phos 38 - 126 U/L 68 - -  AST 15 - 41 U/L 20 - -  ALT 0 - 44 U/L 20 - -         ASSESSMENT & PLAN:   Malignant neoplasm of upper-outer quadrant of left breast in female, estrogen receptor negative ([place]) 1.  Clinical stage IIIc (T1 cN2 M0) left breast TNBC: - She was initially evaluated by Dr.Chirla in  [facility], on 10/08/2017 when she was found to have left breast mass for 3 months, mammogram on 09/26/2017 showed 1.5 x 1.6 cm left axillary tail mass along with 2.9 x 2.2 cm mass in the left axillary area.  Ultrasound-guided biopsy of both showed metastatic high-grade infiltrating ductal carcinoma.  CT scan of the chest, abdomen and pelvis did not reveal any clear metastatic disease prior to start of neoadjuvant chemotherapy. - She completed 4 cycles of dose dense AC on 12/05/2017 - 12 cycles of weekly paclitaxel completed on 03/22/2018. -She was evaluated by Dr. Donne Hazel and left mastectomy and lymph node dissection was done on 04/19/2018.  Pathology showed multiple foci of invasive ductal carcinoma, grade 3, largest spanning 10.5 cm.  Lymphovascular invasion was identified.  23 out of 23 lymph nodes were positive with extracapsular extension.  At least 3 soft tissue axillary deposits were seen.  Surgical resection margins are negative for carcinoma.  ypT3, ypN3a. - I discussed the pathology report in detail with the patient and her sister.  I expressed my disappointment as there was no response to neoadjuvant chemotherapy.  As there was no pre-chemotherapy MRI, we do not know the actual size of the tumor.  Physical exam prior to start of chemotherapy noted the mass is 5 x 4 cm.  Post treatment mass noted by me was 2.5 x 2.5 cm.  It was noted by Dr. Donne Hazel as 4 cm.  As there  was no growth clinically during treatment, we did not do breast MRI while she is receiving chemotherapy.  At this time, I have recommended PET CT scan for restaging purposes to rule out metastatic disease. -If she does not have any evidence of metastatic disease, based on CREATE-X trial, we will give her capecitabine 2 weeks on 1 week off for 6 months.  This had shown to improve disease-free and overall survival particularly in the triple negative cohort.  She will receive radiation after finishing gemcitabine.  She will also be  eligible for immunotherapy trial upon finishing capecitabine. -We will see her back after the PET CT scan.  2.  Genetic testing: She had extensive family history of malignancies.  We have done genetic testing for commonly inheritable genetic mutations, it was reported negative.   3.  Right upper extremity DVT: -She was diagnosed with right upper extremity DVT when she had port placed.  This happened end of February. -She was treated with Eliquis until the port was removed during her breast surgery.  She is off of Eliquis at this time.      Orders placed this encounter:  Orders Placed This Encounter  Procedures  . NM PET Image Initial (PI) Skull Base To Thigh  . CBC with Differential/Platelet  . Comprehensive metabolic panel      Derek Jack, MD [place] [phone]

## 2018-05-17 NOTE — Assessment & Plan Note (Signed)
1.  Clinical stage IIIc (T1 cN2 M0) left breast TNBC: - She was initially evaluated by Dr.Chirla in [facility], on 10/08/2017 when she was found to have left breast mass for 3 months, mammogram on 09/26/2017 showed 1.5 x 1.6 cm left axillary tail mass along with 2.9 x 2.2 cm mass in the left axillary area.  Ultrasound-guided biopsy of both showed metastatic high-grade infiltrating ductal carcinoma.  CT scan of the chest, abdomen and pelvis did not reveal any clear metastatic disease prior to start of neoadjuvant chemotherapy. - She completed 4 cycles of dose dense AC on 12/05/2017 - 12 cycles of weekly paclitaxel completed on 03/22/2018. -She was evaluated by Dr. Donne Hazel and left mastectomy and lymph node dissection was done on 04/19/2018.  Pathology showed multiple foci of invasive ductal carcinoma, grade 3, largest spanning 10.5 cm.  Lymphovascular invasion was identified.  23 out of 23 lymph nodes were positive with extracapsular extension.  At least 3 soft tissue axillary deposits were seen.  Surgical resection margins are negative for carcinoma.  ypT3, ypN3a. - I discussed the pathology report in detail with the patient and her sister.  I expressed my disappointment as there was no response to neoadjuvant chemotherapy.  As there was no pre-chemotherapy MRI, we do not know the actual size of the tumor.  Physical exam prior to start of chemotherapy noted the mass is 5 x 4 cm.  Post treatment mass noted by me was 2.5 x 2.5 cm.  It was noted by Dr. Donne Hazel as 4 cm.  As there was no growth clinically during treatment, we did not do breast MRI while she is receiving chemotherapy.  At this time, I have recommended PET CT scan for restaging purposes to rule out metastatic disease. -If she does not have any evidence of metastatic disease, based on CREATE-X trial, we will give her capecitabine 2 weeks on 1 week off for 6 months.  This had shown to improve disease-free and overall survival particularly in  the triple negative cohort.  She will receive radiation after finishing gemcitabine.  She will also be eligible for immunotherapy trial upon finishing capecitabine. -We will see her back after the PET CT scan.  2.  Genetic testing: She had extensive family history of malignancies.  We have done genetic testing for commonly inheritable genetic mutations, it was reported negative.   3.  Right upper extremity DVT: -She was diagnosed with right upper extremity DVT when she had port placed.  This happened end of February. -She was treated with Eliquis until the port was removed during her breast surgery.  She is off of Eliquis at this time.

## 2018-05-17 NOTE — Patient Instructions (Signed)
[facility] at [facility] Discharge Instructions     Thank you for choosing [facility] at [facility] to provide your oncology and hematology care.  To afford each patient quality time with our provider, please arrive at least 15 minutes before your scheduled appointment time.   If you have a lab appointment with the Cancer Center please come in thru the  Main Entrance and check in at the main information desk  You need to re-schedule your appointment should you arrive 10 or more minutes late.  We strive to give you quality time with our providers, and arriving late affects you and other patients whose appointments are after yours.  Also, if you no show three or more times for appointments you may be dismissed from the clinic at the providers discretion.     Again, thank you for choosing [facility].  Our hope is that these requests will decrease the amount of time that you wait before being seen by our physicians.       _____________________________________________________________  Should you have questions after your visit to [facility], please contact our office at [phone] between the hours of 8:00 a.m. and 4:30 p.m.  Voicemails left after 4:00 p.m. will not be returned until the following business day.  For prescription refill requests, have your pharmacy contact our office and allow 72 hours.    Cancer Center Support Programs:   > Cancer Support Group  2nd Tuesday of the month 1pm-2pm, Journey Room    

## 2018-05-27 ENCOUNTER — Other Ambulatory Visit (HOSPITAL_COMMUNITY): Payer: Self-pay | Admitting: Nurse Practitioner

## 2018-05-28 ENCOUNTER — Other Ambulatory Visit (HOSPITAL_COMMUNITY): Payer: Self-pay | Admitting: Nurse Practitioner

## 2018-05-28 ENCOUNTER — Ambulatory Visit (HOSPITAL_COMMUNITY): Payer: Managed Care, Other (non HMO)

## 2018-05-28 ENCOUNTER — Encounter (HOSPITAL_COMMUNITY): Payer: Self-pay | Admitting: Hematology

## 2018-05-30 ENCOUNTER — Other Ambulatory Visit (HOSPITAL_COMMUNITY): Payer: Managed Care, Other (non HMO)

## 2018-05-30 ENCOUNTER — Ambulatory Visit (HOSPITAL_COMMUNITY): Payer: Managed Care, Other (non HMO) | Admitting: Internal Medicine

## 2018-06-05 ENCOUNTER — Other Ambulatory Visit: Payer: Self-pay

## 2018-06-05 ENCOUNTER — Inpatient Hospital Stay (HOSPITAL_BASED_OUTPATIENT_CLINIC_OR_DEPARTMENT_OTHER): Payer: Managed Care, Other (non HMO) | Admitting: Hematology

## 2018-06-05 ENCOUNTER — Telehealth (HOSPITAL_COMMUNITY): Payer: Self-pay | Admitting: Hematology

## 2018-06-05 ENCOUNTER — Encounter (HOSPITAL_COMMUNITY): Payer: Self-pay | Admitting: Hematology

## 2018-06-05 VITALS — BP 147/79 | HR 77 | Temp 98.4°F | Resp 18 | Wt 181.5 lb

## 2018-06-05 DIAGNOSIS — Z8042 Family history of malignant neoplasm of prostate: Secondary | ICD-10-CM

## 2018-06-05 DIAGNOSIS — Z9012 Acquired absence of left breast and nipple: Secondary | ICD-10-CM | POA: Diagnosis not present

## 2018-06-05 DIAGNOSIS — C50412 Malignant neoplasm of upper-outer quadrant of left female breast: Secondary | ICD-10-CM

## 2018-06-05 DIAGNOSIS — Z171 Estrogen receptor negative status [ER-]: Secondary | ICD-10-CM | POA: Diagnosis not present

## 2018-06-05 DIAGNOSIS — Z86718 Personal history of other venous thrombosis and embolism: Secondary | ICD-10-CM

## 2018-06-05 DIAGNOSIS — G629 Polyneuropathy, unspecified: Secondary | ICD-10-CM

## 2018-06-05 DIAGNOSIS — Z9221 Personal history of antineoplastic chemotherapy: Secondary | ICD-10-CM

## 2018-06-05 DIAGNOSIS — Z803 Family history of malignant neoplasm of breast: Secondary | ICD-10-CM

## 2018-06-05 MED ORDER — CAPECITABINE 500 MG PO TABS: 1000.0000 mg/m2 | ORAL_TABLET | Freq: Two times a day (BID) | ORAL | 0 refills | Status: DC

## 2018-06-05 MED ORDER — INFLUENZA VAC SPLIT HIGH-DOSE 0.5 ML IM SUSY
0.5000 mL | PREFILLED_SYRINGE | INTRAMUSCULAR | Status: AC
  Administered 2018-06-05: 0.5 mL via INTRAMUSCULAR
  Filled 2018-06-05: qty 0.5

## 2018-06-05 NOTE — Assessment & Plan Note (Addendum)
1.  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  Ultrasound-guided biopsy of both showed metastatic high-grade infiltrating ductal carcinoma.  CT scan of the chest, abdomen and pelvis did not reveal any clear metastatic disease prior to start of neoadjuvant chemotherapy. - She completed 4 cycles of dose dense AC on 12/05/2017 - 12 cycles of weekly paclitaxel completed on 03/22/2018. -She was evaluated by Dr. Donne Hazel and left mastectomy and lymph node dissection was done on 04/19/2018.  Pathology showed multiple foci of invasive ductal carcinoma, grade 3, largest spanning 10.5 cm.  Lymphovascular invasion was identified.  23 out of 23 lymph nodes were positive with extracapsular extension.  At least 3 soft tissue axillary deposits were seen.  Surgical resection margins are negative for carcinoma.  ypT3, ypN3a. - I discussed the pathology report in detail with the patient and her sister.  I expressed my disappointment as there was no response to neoadjuvant chemotherapy.  As there was no pre-chemotherapy MRI, we do not know the actual size of the tumor.  Physical exam prior to start of chemotherapy noted the mass is 5 x 4 cm.  Post treatment mass noted by me was 2.5 x 2.5 cm.  It was noted by Dr. Donne Hazel as 4 cm.  As there was no growth clinically during treatment, we did not do breast MRI while she is receiving chemotherapy. -I have recommended PET CT scan on 05/17/2018.  Her insurance has denied it.  I will talk to her insurance and ordered a CT scan of the chest, abdomen and pelvis. -If she does not have any metastatic disease, we will start her on Xeloda at 1250 mg/m twice daily, 2 weeks on 1 week off for 8 cycles.  We talked about the side  effects including but not limited to, cytopenias, nausea, vomiting, diarrhea, hand-foot skin reaction among others.  She understands and gives his permission to proceed with the treatment.  I will start her at low dose of thousand milligrams per meter square initially to see how she tolerates it.  She will also be eligible for immunotherapy trial upon finishing Xeloda.  I will see her back in 7 to 10 days after start of Xeloda.  2.  Genetic testing: She had extensive family history of malignancies.  We have done genetic testing for commonly inheritable genetic mutations, it was reported negative.   3.  Right upper extremity DVT: -She was diagnosed with right upper extremity DVT when she had port placed.  This happened end of February. -She was treated with Eliquis until the port was removed during her breast surgery.  She is off of Eliquis at this time.  4.  Peripheral neuropathy: -She has reported on and off tingling in the hands and feet depending on the posture.  This goes away within few minutes.  No pain was reported.  We will keep an eye on it.

## 2018-06-05 NOTE — Patient Instructions (Signed)
[facility] at [facility] Discharge Instructions     Thank you for choosing [facility] at [facility] to provide your oncology and hematology care.  To afford each patient quality time with our provider, please arrive at least 15 minutes before your scheduled appointment time.   If you have a lab appointment with the Cancer Center please come in thru the  Main Entrance and check in at the main information desk  You need to re-schedule your appointment should you arrive 10 or more minutes late.  We strive to give you quality time with our providers, and arriving late affects you and other patients whose appointments are after yours.  Also, if you no show three or more times for appointments you may be dismissed from the clinic at the providers discretion.     Again, thank you for choosing [facility].  Our hope is that these requests will decrease the amount of time that you wait before being seen by our physicians.       _____________________________________________________________  Should you have questions after your visit to [facility], please contact our office at [phone] between the hours of 8:00 a.m. and 4:30 p.m.  Voicemails left after 4:00 p.m. will not be returned until the following business day.  For prescription refill requests, have your pharmacy contact our office and allow 72 hours.    Cancer Center Support Programs:   > Cancer Support Group  2nd Tuesday of the month 1pm-2pm, Journey Room    

## 2018-06-05 NOTE — Telephone Encounter (Signed)
PC TO [facility] WITH RN REVIEWER RE PENDING CT AUTHS. GAVE CLINICAL INFO. WENT TO MD REVIEW. PER April P SCAN WILL NOT BE CX BECAUSE IT IS STAT

## 2018-06-05 NOTE — Progress Notes (Signed)
[place] [place], [place] [address]   CLINIC:  Medical Oncology/Hematology  PCP:  Eber Hong, MD [id number] [id number] [phone]   REASON FOR VISIT:  Follow-up for breast cancer  CURRENT THERAPY: S/P left mastectomy starting on xeloda  BRIEF ONCOLOGIC HISTORY:    Malignant neoplasm of upper-outer quadrant of left breast in female, estrogen receptor negative ([place])   09/26/2017 Initial Biopsy    Biopsy of the left breast and left axillary lymph node consistent with high-grade TNBC, Ki-67 of 80%  Clinical stage T1cN2 M0    09/26/2017 Family History    Sister had breast cancer at age 34, HER-2 positive, multiple paternal uncles had prostate cancer, father had prostate cancer, 2 paternal cousins had breast cancer     10/11/2017 Echocardiogram    Echocardiogram on 10/11/2017 showed ejection fraction of 65-70%    10/18/2017 Imaging    CT scan of the chest, abdomen and pelvis shows tiny circumscribed noncalcified nodule in the posterior segment of the left upper lobe of the lung with no other evidence of metastatic disease  MRI of the abdomen with and without contrast ordered for elevated total bilirubin on 10/29/2017 shows no evidence of liver metastasis    10/22/2017 Genetic Testing    [employer] myRisk test for BRCA 1 and 2 and other mutations negative    10/23/2017 -  Chemotherapy    The patient had DOXOrubicin (ADRIAMYCIN) chemo injection 114 mg, 60 mg/m2, Intravenous,  Once, 4 of 4 cycles Administration: 114 mg (12/05/2017) palonosetron (ALOXI) injection 0.25 mg, 0.25 mg, Intravenous,  Once, 8 of 8 cycles Administration: 0.25 mg (12/20/2017), 0.25 mg (12/05/2017), 0.25 mg (01/17/2018), 0.25 mg (12/27/2017), 0.25 mg (01/10/2018), 0.25 mg (01/25/2018), 0.25 mg (02/15/2018), 0.25 mg (02/22/2018), 0.25 mg (03/01/2018), 0.25 mg (03/15/2018), 0.25 mg (03/22/2018), 0.25 mg (02/08/2018), 0.25 mg (03/08/2018) pegfilgrastim (NEULASTA ONPRO KIT) injection 6  mg, 6 mg, Subcutaneous, Once, 1 of 1 cycle Administration: 6 mg (12/05/2017) filgrastim (NEUPOGEN) injection 480 mcg, 480 mcg, Subcutaneous, Once PRN, 1 of 1 cycle pegfilgrastim-cbqv (UDENYCA) injection 6 mg, 6 mg, Subcutaneous, Once, 3 of 3 cycles CARBOplatin (PARAPLATIN) 230 mg in sodium chloride 0.9 % 250 mL chemo infusion, 230 mg (100 % of original dose 230.8 mg), Intravenous,  Once, 4 of 4 cycles Dose modification:   (original dose 230.8 mg, Cycle 5), 100 mg (original dose 100 mg, Cycle 6),   (original dose 230.8 mg, Cycle 5), 184.64 mg (80 % of original dose 230.8 mg, Cycle 5, Reason: Provider Judgment), 150 mg (original dose 100 mg, Cycle 7, Reason: Provider Judgment),   (original dose 100 mg, Cycle 7, Reason: Provider Judgment), 150 mg (original dose 100 mg, Cycle 7, Reason: Provider Judgment), 150 mg (original dose 100 mg, Cycle 8, Reason: Provider Judgment),   (original dose 100 mg, Cycle 8, Reason: Provider Judgment), 150 mg (original dose 100 mg, Cycle 6, Reason: Provider Judgment) Administration: 230 mg (12/20/2017), 100 mg (01/17/2018), 180 mg (12/27/2017), 150 mg (02/15/2018), 200 mg (02/22/2018), 150 mg (03/01/2018), 150 mg (03/15/2018), 200 mg (03/22/2018), 150 mg (02/08/2018), 100 mg (03/08/2018) cyclophosphamide (CYTOXAN) 1,140 mg in sodium chloride 0.9 % 250 mL chemo infusion, 600 mg/m2, Intravenous,  Once, 4 of 4 cycles Administration: 1,140 mg (12/05/2017) PACLitaxel (TAXOL) 150 mg in dextrose 5 % 250 mL chemo infusion (</= 75m/m2), 80 mg/m2 = 150 mg, Intravenous,  Once, 4 of 4 cycles Administration: 150 mg (12/20/2017), 150 mg (12/27/2017), 150 mg (01/17/2018), 150 mg (01/10/2018), 150  mg (03/15/2018), 150 mg (01/25/2018), 150 mg (02/15/2018), 150 mg (02/22/2018), 150 mg (03/01/2018), 150 mg (03/22/2018), 150 mg (02/08/2018), 150 mg (03/08/2018) fosaprepitant (EMEND) 150 mg, dexamethasone (DECADRON) 12 mg in sodium chloride 0.9 % 145 mL IVPB, , Intravenous,  Once, 5 of 5 cycles Administration:  (12/20/2017),   (12/05/2017)  for chemotherapy treatment.     10/24/2017 -  Neo-Adjuvant Chemotherapy    Dose dense AC for 4 cycles followed by weekly carboplatin and paclitaxel for 12 weeks      CANCER STAGING: Cancer Staging Malignant neoplasm of upper-outer quadrant of left breast in female, estrogen receptor negative ([place]) Staging form: Breast, AJCC 8th Edition - Clinical: Stage IIIC (cT1, cN2, cM0, G3, ER: Negative, PR: Negative, HER2: Negative) - Signed by Holley Bouche, NP on 11/28/2017    INTERVAL HISTORY:  Ms. Coriz 67 y.o. female returns for routine follow-up for breast cancer. Patient is here today with her family. She is having tingling in her finger tips but only in certain positions. It is not constant and is very mild. She has mild lymph edema and wears a compression sleeve all day and this helps. Her appetite and energy level are at 100% and she is maintaining her weight at this time. She denies any other pain at this time. Denies any nausea, vomiting, or dairrhea.     REVIEW OF SYSTEMS:  Review of Systems  All other systems reviewed and are negative.    PAST MEDICAL/SURGICAL HISTORY:  Past Medical History:  Diagnosis Date  . Breast cancer, left breast ([place]) 2019  . H/O seasonal allergies   . Heart murmur   . High cholesterol   . Hypertension   . Melanoma ([place])    left posterior forearm  . PONV (postoperative nausea and vomiting)   . Thrombosis    in right arm developed after 2nd round of chemotherapy   Past Surgical History:  Procedure Laterality Date  . BREAST BIOPSY Left 09/2017  . BREAST CYST EXCISION Left ~ 2004  . BUNIONECTOMY Bilateral   . MASTECTOMY MODIFIED RADICAL Left 04/19/2018  . MASTECTOMY MODIFIED RADICAL Left 04/19/2018   Procedure: LEFT MODIFIED RADICAL MASTECTOMY;  Surgeon: Rolm Bookbinder, MD;  Location: [facility];  Service: General;  Laterality: Left;  Marland Kitchen MELANOMA EXCISION Left    posterior forearm  . PORT-A-CATH REMOVAL Right 04/19/2018    Procedure: REMOVAL PORT-A-CATH;  Surgeon: Rolm Bookbinder, MD;  Location: [facility];  Service: General;  Laterality: Right;  . PORTA CATH INSERTION Right   . PORTA CATH REMOVAL Right 04/19/2018  . TUBAL LIGATION       SOCIAL HISTORY:  Social History   Socioeconomic History  . Marital status: Divorced    Spouse name: Not on file  . Number of children: Not on file  . Years of education: Not on file  . Highest education level: Not on file  Occupational History  . Not on file  Social Needs  . Financial resource strain: Not on file  . Food insecurity:    Worry: Not on file    Inability: Not on file  . Transportation needs:    Medical: Not on file    Non-medical: Not on file  Tobacco Use  . Smoking status: Never Smoker  . Smokeless tobacco: Never Used  Substance and Sexual Activity  . Alcohol use: Never    Frequency: Never  . Drug use: Never  . Sexual activity: Not on file  Lifestyle  . Physical activity:    Days per  week: Not on file    Minutes per session: Not on file  . Stress: Not on file  Relationships  . Social connections:    Talks on phone: Not on file    Gets together: Not on file    Attends religious service: Not on file    Active member of club or organization: Not on file    Attends meetings of clubs or organizations: Not on file    Relationship status: Not on file  . Intimate partner violence:    Fear of current or ex partner: Not on file    Emotionally abused: Not on file    Physically abused: Not on file    Forced sexual activity: Not on file  Other Topics Concern  . Not on file  Social History Narrative  . Not on file    FAMILY HISTORY:  History reviewed. No pertinent family history.  CURRENT MEDICATIONS:  Outpatient Encounter Medications as of 06/05/2018  Medication Sig  . amLODipine (NORVASC) 5 MG tablet Take 5 mg by mouth at bedtime.   Marland Kitchen atorvastatin (LIPITOR) 10 MG tablet   . Calcium-Magnesium-Vitamin D (CALCIUM 1200+D3 PO) Take 2 tablets  by mouth daily with lunch.  . fexofenadine (ALLEGRA) 180 MG tablet Take 180 mg by mouth daily as needed for allergies or rhinitis.  . hydrochlorothiazide (HYDRODIURIL) 25 MG tablet Take 25 mg by mouth daily.   Marland Kitchen KLOR-CON M20 20 MEQ tablet Take 20 mEq by mouth daily.  . Multiple Vitamin (MULTIVITAMIN WITH MINERALS) TABS tablet Take 1 tablet by mouth daily with lunch.  . polyethylene glycol (MIRALAX / GLYCOLAX) packet Take 17 g by mouth daily as needed for moderate constipation.  . ramipril (ALTACE) 5 MG capsule Take 5 mg by mouth daily.   . Vitamin D, Ergocalciferol, (DRISDOL) 50000 units CAPS capsule Take 50,000 Units by mouth every Monday.   . capecitabine (XELODA) 500 MG tablet Take 4 tablets (2,000 mg total) by mouth 2 (two) times daily after a meal. 2 weeks on/1 week off  . [EXPIRED] Influenza vac split quadrivalent PF (FLUZONE HIGH-DOSE) injection 0.5 mL    No facility-administered encounter medications on file as of 06/05/2018.     ALLERGIES:  Allergies  Allergen Reactions  . Penicillin G Rash and Other (See Comments)    PATIENT HAS HAD A PCN REACTION WITH IMMEDIATE RASH, FACIAL/TONGUE/THROAT SWELLING, SOB, OR LIGHTHEADEDNESS WITH HYPOTENSION:  #  #  YES  #  #  Has patient had a PCN reaction causing severe rash involving mucus membranes or skin necrosis: No PATIENT HAS HAD A PCN REACTION THAT REQUIRED HOSPITALIZATION:  #  #  YES  #  #  Has patient had a PCN reaction occurring within the last 10 years: No   . Latex Rash     PHYSICAL EXAM:  ECOG Performance status: 1  Vitals:   06/05/18 0947  BP: (!) 147/79  Pulse: 77  Resp: 18  Temp: 98.4 F (36.9 C)  SpO2: 100%   Filed Weights   06/05/18 0947  Weight: 181 lb 8 oz (82.3 kg)    Physical Exam  Constitutional: She is oriented to person, place, and time. She appears well-developed and well-nourished.  Musculoskeletal: Normal range of motion.  Neurological: She is alert and oriented to person, place, and time.  Skin:  Skin is warm and dry.  Psychiatric: She has a normal mood and affect. Her behavior is normal. Judgment and thought content normal.  Left mastectomy site is within normal limits  and well-healed.  No palpable adenopathy in the supraclavicular or axillary region.   LABORATORY DATA:  I have reviewed the labs as listed.  CBC    Component Value Date/Time   WBC 2.7 (L) 05/17/2018 0933   RBC 4.44 05/17/2018 0933   HGB 12.4 05/17/2018 0933   HGB 11.6 03/20/2018 1313   HCT 39.0 05/17/2018 0933   PLT 233 05/17/2018 0933   PLT 202 03/20/2018 1313   MCV 87.8 05/17/2018 0933   MCH 27.9 05/17/2018 0933   MCHC 31.8 05/17/2018 0933   RDW 13.5 05/17/2018 0933   LYMPHSABS 0.8 05/17/2018 0933   MONOABS 0.4 05/17/2018 0933   EOSABS 0.1 05/17/2018 0933   BASOSABS 0.0 05/17/2018 0933   CMP Latest Ref Rng & Units 05/17/2018 04/20/2018 04/15/2018  Glucose 70 - 99 mg/dL 95 109(H) 101(H)  BUN 8 - 23 mg/dL 13 <5(L) 11  Creatinine 0.44 - 1.00 mg/dL 0.63 0.75 0.71  Sodium 135 - 145 mmol/L 140 138 140  Potassium 3.5 - 5.1 mmol/L 4.2 3.4(L) 4.1  Chloride 98 - 111 mmol/L 103 102 100  CO2 22 - 32 mmol/L _0 Calcium 8.9 - 10.3 mg/dL 10.3 9.4 10.3  Total Protein 6.5 - 8.1 g/dL 7.3 - -  Total Bilirubin 0.3 - 1.2 mg/dL 1.1 - -  Alkaline Phos 38 - 126 U/L 68 - -  AST 15 - 41 U/L 20 - -  ALT 0 - 44 U/L 20 - -         ASSESSMENT & PLAN:   Malignant neoplasm of upper-outer quadrant of left breast in female, estrogen receptor negative ([place]) 1.  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  Ultrasound-guided biopsy of both showed metastatic high-grade infiltrating ductal carcinoma.  CT scan of the chest, abdomen and pelvis did not reveal any  clear metastatic disease prior to start of neoadjuvant chemotherapy. - She completed 4 cycles of dose dense AC on 12/05/2017 - 12 cycles of weekly paclitaxel completed on 03/22/2018. -She was evaluated by Dr. Donne Hazel and left mastectomy and lymph node dissection was done on 04/19/2018.  Pathology showed multiple foci of invasive ductal carcinoma, grade 3, largest spanning 10.5 cm.  Lymphovascular invasion was identified.  23 out of 23 lymph nodes were positive with extracapsular extension.  At least 3 soft tissue axillary deposits were seen.  Surgical resection margins are negative for carcinoma.  ypT3, ypN3a. - I discussed the pathology report in detail with the patient and her sister.  I expressed my disappointment as there was no response to neoadjuvant chemotherapy.  As there was no pre-chemotherapy MRI, we do not know the actual size of the tumor.  Physical exam prior to start of chemotherapy noted the mass is 5 x 4 cm.  Post treatment mass noted by me was 2.5 x 2.5 cm.  It was noted by Dr. Donne Hazel as 4 cm.  As there was no growth clinically during treatment, we did not do breast MRI while she is receiving chemotherapy. -I have recommended PET CT scan on 05/17/2018.  Her insurance has denied it.  I will talk to her insurance and ordered a CT scan of the chest, abdomen and pelvis. -If she  does not have any metastatic disease, we will start her on Xeloda at 1250 mg/m twice daily, 2 weeks on 1 week off for 8 cycles.  We talked about the side effects including but not limited to, cytopenias, nausea, vomiting, diarrhea, hand-foot skin reaction among others.  She understands and gives his permission to proceed with the treatment.  I will start her at low dose of thousand milligrams per meter square initially to see how she tolerates it.  She will also be eligible for immunotherapy trial upon finishing Xeloda.  I will see her back in 7 to 10 days after start of Xeloda.  2.  Genetic testing: She had extensive  family history of malignancies.  We have done genetic testing for commonly inheritable genetic mutations, it was reported negative.   3.  Right upper extremity DVT: -She was diagnosed with right upper extremity DVT when she had port placed.  This happened end of February. -She was treated with Eliquis until the port was removed during her breast surgery.  She is off of Eliquis at this time.  4.  Peripheral neuropathy: -She has reported on and off tingling in the hands and feet depending on the posture.  This goes away within few minutes.  No pain was reported.  We will keep an eye on it.      Orders placed this encounter:  Orders Placed This Encounter  Procedures  . CT Abdomen Pelvis W Contrast  . CT Chest W Contrast  . Magnesium  . CBC with Differential/Platelet  . Comprehensive metabolic panel      Derek Jack, MD [place] [phone]

## 2018-06-06 ENCOUNTER — Telehealth (HOSPITAL_COMMUNITY): Payer: Self-pay | Admitting: Pharmacist

## 2018-06-06 ENCOUNTER — Ambulatory Visit (HOSPITAL_COMMUNITY)
Admission: RE | Admit: 2018-06-06 | Discharge: 2018-06-06 | Disposition: A | Discharge status: Home / Self Care | Payer: Managed Care, Other (non HMO) | Source: Ambulatory Visit | Attending: Nurse Practitioner | Admitting: Nurse Practitioner

## 2018-06-06 ENCOUNTER — Encounter: Payer: Self-pay | Admitting: Pharmacist

## 2018-06-06 DIAGNOSIS — Z171 Estrogen receptor negative status [ER-]: Principal | ICD-10-CM

## 2018-06-06 DIAGNOSIS — C50412 Malignant neoplasm of upper-outer quadrant of left female breast: Secondary | ICD-10-CM | POA: Diagnosis not present

## 2018-06-06 DIAGNOSIS — C787 Secondary malignant neoplasm of liver and intrahepatic bile duct: Secondary | ICD-10-CM | POA: Diagnosis not present

## 2018-06-06 DIAGNOSIS — Z9012 Acquired absence of left breast and nipple: Secondary | ICD-10-CM | POA: Insufficient documentation

## 2018-06-06 DIAGNOSIS — M4854XA Collapsed vertebra, not elsewhere classified, thoracic region, initial encounter for fracture: Secondary | ICD-10-CM | POA: Diagnosis not present

## 2018-06-06 DIAGNOSIS — R918 Other nonspecific abnormal finding of lung field: Secondary | ICD-10-CM | POA: Diagnosis not present

## 2018-06-06 DIAGNOSIS — D259 Leiomyoma of uterus, unspecified: Secondary | ICD-10-CM | POA: Diagnosis not present

## 2018-06-06 MED ORDER — SODIUM CHLORIDE 0.9 % IJ SOLN
INTRAMUSCULAR | Status: AC
  Filled 2018-06-06: qty 50

## 2018-06-06 MED ORDER — IOPAMIDOL (ISOVUE-300) INJECTION 61%: 100.0000 mL | Freq: Once | INTRAVENOUS | Status: DC | PRN

## 2018-06-06 MED ORDER — IOHEXOL 300 MG/ML  SOLN
100.0000 mL | Freq: Once | INTRAMUSCULAR | Status: AC | PRN
  Administered 2018-06-06: 100 mL via INTRAVENOUS

## 2018-06-06 NOTE — Telephone Encounter (Signed)
Oral Oncology Pharmacist Encounter  Received new prescription for Xeloda (capecitabine) for the treatment of stage IIIc left breast triple negative breast cancer, planned duration until disease progression or unacceptable drug toxicity.  CBC/CMP from 05/17/18 assessed, no relevant lab abnormalities. Prescription dose and frequency assessed.   Current medication list in Epic reviewed, no relevant DDIs with capecitabine identified.  Prescription has been e-scribed to the [facility] for benefits analysis and approval.  Oral Oncology Clinic will continue to follow for insurance authorization, copayment issues, initial counseling and start date.  Darl Pikes, PharmD, BCPS, [facility] Hematology/Oncology Clinical Pharmacist [facility] [place] [facility] [phone]  06/06/2018 3:36 PM

## 2018-06-06 NOTE — Telephone Encounter (Signed)
Erroneous Encounter

## 2018-06-10 ENCOUNTER — Other Ambulatory Visit (HOSPITAL_COMMUNITY): Payer: Self-pay | Admitting: Nurse Practitioner

## 2018-06-10 DIAGNOSIS — Z171 Estrogen receptor negative status [ER-]: Principal | ICD-10-CM

## 2018-06-10 DIAGNOSIS — C50412 Malignant neoplasm of upper-outer quadrant of left female breast: Secondary | ICD-10-CM

## 2018-06-11 ENCOUNTER — Telehealth (HOSPITAL_COMMUNITY): Payer: Self-pay | Admitting: Hematology

## 2018-06-11 ENCOUNTER — Telehealth (HOSPITAL_COMMUNITY): Payer: Self-pay | Admitting: Pharmacy Technician

## 2018-06-11 MED ORDER — CAPECITABINE 500 MG PO TABS: 1000.0000 mg/m2 | ORAL_TABLET | Freq: Two times a day (BID) | ORAL | 0 refills | Status: DC

## 2018-06-11 NOTE — Telephone Encounter (Signed)
Oral Chemotherapy Pharmacist Encounter  Due to insurance restriction the medication could not be filled at [place]. Prescription has been e-scribed to [place] [facility].  Supportive information was faxed to [place]. We will continue to follow medication access and inform the office of when the medication will be available.  Spoke with patient and daughter to provide them with status update. Also provided them with telephone number for [place].  Darl Pikes, PharmD, BCPS, [facility] Hematology/Oncology Clinical Pharmacist [facility] [place] [facility] [phone]  06/11/2018 11:12 AM

## 2018-06-11 NOTE — Telephone Encounter (Signed)
Oral Oncology Patient Advocate Encounter  Prior Authorization for Xeloda (Capecitiabine) has been approved.    PA# [id number] Effective dates: 06/11/18 through 06/11/2019  Patient must fill Xeloda through [place].  Rx will be sent to [facility] and will follow up until patient receives medication.    Oral Oncology Clinic will continue to follow.   [place] Patient [place] Phone [phone] Fax [phone] 06/11/2018 11:20 AM

## 2018-06-11 NOTE — Telephone Encounter (Signed)
pc from [facility] verifying  clinical info and advising that the PET will have to go to MD review

## 2018-06-11 NOTE — Telephone Encounter (Signed)
Oral Oncology Patient Advocate Encounter  Received notification from Christella Scheuermann that prior authorization for Xeloda is required.  PA submitted on CoverMyMeds Request ID [id number] Status is pending  Oral Oncology Clinic will continue to follow.  [place] Patient [place] Phone [phone] Fax [phone] 06/11/2018 11:17 AM

## 2018-06-12 NOTE — Telephone Encounter (Signed)
Oral Oncology Patient Advocate Encounter  Called to check the status of prescription at [place].  Prescription is currently under benefits investigation.  I gave Vickie the PA approval number from [employer] to help in the process.  Vickie noted her case as an Urgent request.  I will follow up for copay and shipment status.  [place] Patient [place] Phone [phone] Fax [phone] 06/12/2018 3:27 PM

## 2018-06-13 ENCOUNTER — Other Ambulatory Visit (HOSPITAL_COMMUNITY): Payer: Self-pay | Admitting: Nurse Practitioner

## 2018-06-13 DIAGNOSIS — C50412 Malignant neoplasm of upper-outer quadrant of left female breast: Secondary | ICD-10-CM

## 2018-06-13 DIAGNOSIS — Z171 Estrogen receptor negative status [ER-]: Principal | ICD-10-CM

## 2018-06-14 ENCOUNTER — Encounter (HOSPITAL_COMMUNITY): Payer: Self-pay | Admitting: *Deleted

## 2018-06-14 NOTE — Progress Notes (Signed)
PDL 1 sent on accession [id number]. I spoke with Drue Dun in pathology.

## 2018-06-17 ENCOUNTER — Encounter (HOSPITAL_COMMUNITY)
Admission: RE | Admit: 2018-06-17 | Discharge: 2018-06-17 | Disposition: A | Discharge status: Home / Self Care | Payer: Managed Care, Other (non HMO) | Source: Ambulatory Visit | Attending: Nurse Practitioner | Admitting: Nurse Practitioner

## 2018-06-17 ENCOUNTER — Other Ambulatory Visit (HOSPITAL_COMMUNITY)
Admission: RE | Admit: 2018-06-17 | Disposition: A | Discharge status: Home / Self Care | Payer: Managed Care, Other (non HMO) | Source: Ambulatory Visit | Admitting: Hematology

## 2018-06-17 ENCOUNTER — Other Ambulatory Visit (HOSPITAL_COMMUNITY)
Admission: RE | Admit: 2018-06-17 | Discharge: 2018-06-17 | Disposition: A | Discharge status: Home / Self Care | Payer: Managed Care, Other (non HMO) | Source: Ambulatory Visit | Attending: Hematology | Admitting: Hematology

## 2018-06-17 DIAGNOSIS — C50412 Malignant neoplasm of upper-outer quadrant of left female breast: Secondary | ICD-10-CM | POA: Diagnosis not present

## 2018-06-17 DIAGNOSIS — Z171 Estrogen receptor negative status [ER-]: Secondary | ICD-10-CM | POA: Insufficient documentation

## 2018-06-17 LAB — GLUCOSE, CAPILLARY: Glucose-Capillary: 88 mg/dL (ref 70–99)

## 2018-06-17 MED ORDER — FLUDEOXYGLUCOSE F - 18 (FDG) INJECTION
10.1000 | Freq: Once | INTRAVENOUS | Status: AC | PRN
  Administered 2018-06-17: 10.1 via INTRAVENOUS

## 2018-06-17 NOTE — Telephone Encounter (Signed)
Oral Oncology Patient Advocate Encounter  Called to check status of prescription from [place].  Spoke with Caryl Pina.  Prescription is rejected by system for invalid BIN/PCN.  She will send a message to the person working on this claim to have them reach out to me about this prescription.    If I haven't heard anything by 2pm I will call back to check with them again.  [place] Patient [place] Phone [phone] Fax [phone] 06/17/2018 9:24 AM

## 2018-06-18 ENCOUNTER — Other Ambulatory Visit (HOSPITAL_COMMUNITY): Payer: Managed Care, Other (non HMO)

## 2018-06-19 ENCOUNTER — Inpatient Hospital Stay (HOSPITAL_COMMUNITY): Payer: Managed Care, Other (non HMO) | Attending: Hematology | Admitting: Hematology

## 2018-06-19 ENCOUNTER — Encounter (HOSPITAL_COMMUNITY): Payer: Self-pay | Admitting: Hematology

## 2018-06-19 VITALS — BP 144/79 | HR 89 | Temp 98.3°F | Resp 18 | Wt 183.8 lb

## 2018-06-19 DIAGNOSIS — E7800 Pure hypercholesterolemia, unspecified: Secondary | ICD-10-CM | POA: Diagnosis not present

## 2018-06-19 DIAGNOSIS — Z8042 Family history of malignant neoplasm of prostate: Secondary | ICD-10-CM | POA: Insufficient documentation

## 2018-06-19 DIAGNOSIS — Z85820 Personal history of malignant melanoma of skin: Secondary | ICD-10-CM | POA: Diagnosis not present

## 2018-06-19 DIAGNOSIS — C773 Secondary and unspecified malignant neoplasm of axilla and upper limb lymph nodes: Secondary | ICD-10-CM | POA: Insufficient documentation

## 2018-06-19 DIAGNOSIS — M25512 Pain in left shoulder: Secondary | ICD-10-CM | POA: Insufficient documentation

## 2018-06-19 DIAGNOSIS — G629 Polyneuropathy, unspecified: Secondary | ICD-10-CM | POA: Diagnosis not present

## 2018-06-19 DIAGNOSIS — Z803 Family history of malignant neoplasm of breast: Secondary | ICD-10-CM | POA: Diagnosis not present

## 2018-06-19 DIAGNOSIS — C7951 Secondary malignant neoplasm of bone: Secondary | ICD-10-CM | POA: Diagnosis not present

## 2018-06-19 DIAGNOSIS — I10 Essential (primary) hypertension: Secondary | ICD-10-CM | POA: Diagnosis not present

## 2018-06-19 DIAGNOSIS — Z86718 Personal history of other venous thrombosis and embolism: Secondary | ICD-10-CM | POA: Diagnosis not present

## 2018-06-19 DIAGNOSIS — Z5111 Encounter for antineoplastic chemotherapy: Secondary | ICD-10-CM | POA: Insufficient documentation

## 2018-06-19 DIAGNOSIS — Z9012 Acquired absence of left breast and nipple: Secondary | ICD-10-CM | POA: Diagnosis not present

## 2018-06-19 DIAGNOSIS — Z79899 Other long term (current) drug therapy: Secondary | ICD-10-CM | POA: Insufficient documentation

## 2018-06-19 DIAGNOSIS — C50412 Malignant neoplasm of upper-outer quadrant of left female breast: Secondary | ICD-10-CM | POA: Insufficient documentation

## 2018-06-19 DIAGNOSIS — Z7189 Other specified counseling: Secondary | ICD-10-CM

## 2018-06-19 DIAGNOSIS — Z171 Estrogen receptor negative status [ER-]: Secondary | ICD-10-CM | POA: Insufficient documentation

## 2018-06-19 DIAGNOSIS — Z880 Allergy status to penicillin: Secondary | ICD-10-CM | POA: Diagnosis not present

## 2018-06-19 MED ORDER — KLOR-CON M20 20 MEQ PO TBCR: 20.0000 meq | EXTENDED_RELEASE_TABLET | Freq: Every day | ORAL | 4 refills | Status: DC

## 2018-06-19 NOTE — Patient Instructions (Signed)
[facility] Discharge Instructions     Thank you for choosing [facility] to provide your oncology and hematology care.  To afford each patient quality time with our provider, please arrive at least 15 minutes before your scheduled appointment time.   If you have a lab appointment with the Cancer Center please come in thru the  Main Entrance and check in at the main information desk  You need to re-schedule your appointment should you arrive 10 or more minutes late.  We strive to give you quality time with our providers, and arriving late affects you and other patients whose appointments are after yours.  Also, if you no show three or more times for appointments you may be dismissed from the clinic at the providers discretion.     Again, thank you for choosing [facility].  Our hope is that these requests will decrease the amount of time that you wait before being seen by our physicians.       _____________________________________________________________  Should you have questions after your visit to [facility], please contact our office at [phone] between the hours of 8:00 a.m. and 4:30 p.m.  Voicemails left after 4:00 p.m. will not be returned until the following business day.  For prescription refill requests, have your pharmacy contact our office and allow 72 hours.    Cancer Center Support Programs:   > Cancer Support Group  2nd Tuesday of the month 1pm-2pm, Journey Room    

## 2018-06-19 NOTE — Assessment & Plan Note (Addendum)
1.  Stage IV left breast TNBC: - She was initially evaluated by Dr.Chirla in [facility], on 10/08/2017 when she was found to have left breast mass for 3 months, mammogram on 09/26/2017 showed 1.5 x 1.6 cm left axillary tail mass along with 2.9 x 2.2 cm mass in the left axillary area.  Ultrasound-guided biopsy of both showed metastatic high-grade infiltrating ductal carcinoma.  CT scan of the chest, abdomen and pelvis did not reveal any clear metastatic disease prior to start of neoadjuvant chemotherapy. - She completed 4 cycles of dose dense AC on 12/05/2017 - 12 cycles of weekly paclitaxel completed on 03/22/2018. - We reviewed the results of the PET CT scan dated 06/17/2018 which showed PET positivity in the left chest wall musculature, evidence of metastatic adenopathy in the left axilla, internal mammary nodes and upper abdominal, periaortic and periportal nodes.  Multiple hepatic metastasis and innumerable skeletal metastasis were seen. - Unfortunately she has developed metastatic disease while she was receiving neoadjuvant chemotherapy.  She is completely asymptomatic at this time.  We have sent her tumor for PDL 1 testing. - If she is positive, I have recommended combination therapy with Atezolizumab and Abraxane which shows a response rates of close to 60%.  Median overall survival was also 10 months longer for PDL 1+ patients.  We talked about the regimen and side effects in detail. -I will reach out to my colleagues at [facility] and [facility] to see if they have any better clinical trials available.  Patient is agreeable to see the specialist there if they have a good trials. -She will also require port placement.  We will make a referral.  2.  Genetic testing: She had extensive family history of malignancies.  We have done genetic testing for commonly inheritable genetic mutations, it was reported negative.   3.  Right upper extremity DVT: -She was diagnosed with right upper extremity DVT  when she had port placed.  This happened end of February. -She was treated with Eliquis until the port was removed during her breast surgery.  She is off of Eliquis at this time.  4.  Peripheral neuropathy: - She has on and off tingling in the feet which lasts only few minutes.  This is likely from paclitaxel.

## 2018-06-19 NOTE — Assessment & Plan Note (Signed)
We discussed the progression to stage IV disease and treatment in the palliative setting.

## 2018-06-19 NOTE — Progress Notes (Signed)
Brenda Richmond, [place] [address]   CLINIC:  Medical Oncology/Hematology  PCP:  Brenda Hong, MD [id number] [phone]   REASON FOR VISIT: Follow-up for metastatic breast cancer to lymph nodes and bones  CURRENT THERAPY: Tecentriq and Abraxane  BRIEF ONCOLOGIC HISTORY:    Malignant neoplasm of upper-outer quadrant of left breast in female, estrogen receptor negative ([place])   09/26/2017 Initial Biopsy    Biopsy of the left breast and left axillary lymph node consistent with high-grade TNBC, Ki-67 of 80%  Clinical stage T1cN2 M0    09/26/2017 Family History    Sister had breast cancer at age 62, HER-2 positive, multiple paternal uncles had prostate cancer, father had prostate cancer, 2 paternal cousins had breast cancer     10/11/2017 Echocardiogram    Echocardiogram on 10/11/2017 showed ejection fraction of 65-70%    10/18/2017 Imaging    CT scan of the chest, abdomen and pelvis shows tiny circumscribed noncalcified nodule in the posterior segment of the left upper lobe of the lung with no other evidence of metastatic disease  MRI of the abdomen with and without contrast ordered for elevated total bilirubin on 10/29/2017 shows no evidence of liver metastasis    10/22/2017 Genetic Testing    [employer] myRisk test for BRCA 1 and 2 and other mutations negative    10/23/2017 - 03/28/2018 Chemotherapy    The patient had DOXOrubicin (ADRIAMYCIN) chemo injection 114 mg, 60 mg/m2, Intravenous,  Once, 4 of 4 cycles Administration: 114 mg (12/05/2017) palonosetron (ALOXI) injection 0.25 mg, 0.25 mg, Intravenous,  Once, 8 of 8 cycles Administration: 0.25 mg (12/20/2017), 0.25 mg (12/05/2017), 0.25 mg (01/17/2018), 0.25 mg (12/27/2017), 0.25 mg (01/10/2018), 0.25 mg (01/25/2018), 0.25 mg (02/15/2018), 0.25 mg (02/22/2018), 0.25 mg (03/01/2018), 0.25 mg (03/15/2018), 0.25 mg (03/22/2018), 0.25 mg (02/08/2018), 0.25 mg (03/08/2018) pegfilgrastim  (NEULASTA ONPRO KIT) injection 6 mg, 6 mg, Subcutaneous, Once, 1 of 1 cycle Administration: 6 mg (12/05/2017) filgrastim (NEUPOGEN) injection 480 mcg, 480 mcg, Subcutaneous, Once PRN, 1 of 1 cycle pegfilgrastim-cbqv (UDENYCA) injection 6 mg, 6 mg, Subcutaneous, Once, 3 of 3 cycles CARBOplatin (PARAPLATIN) 230 mg in sodium chloride 0.9 % 250 mL chemo infusion, 230 mg (100 % of original dose 230.8 mg), Intravenous,  Once, 4 of 4 cycles Dose modification:   (original dose 230.8 mg, Cycle 5), 100 mg (original dose 100 mg, Cycle 6),   (original dose 230.8 mg, Cycle 5), 184.64 mg (80 % of original dose 230.8 mg, Cycle 5, Reason: Provider Judgment), 150 mg (original dose 100 mg, Cycle 7, Reason: Provider Judgment),   (original dose 100 mg, Cycle 7, Reason: Provider Judgment), 150 mg (original dose 100 mg, Cycle 7, Reason: Provider Judgment), 150 mg (original dose 100 mg, Cycle 8, Reason: Provider Judgment),   (original dose 100 mg, Cycle 8, Reason: Provider Judgment), 150 mg (original dose 100 mg, Cycle 6, Reason: Provider Judgment) Administration: 230 mg (12/20/2017), 100 mg (01/17/2018), 180 mg (12/27/2017), 150 mg (02/15/2018), 200 mg (02/22/2018), 150 mg (03/01/2018), 150 mg (03/15/2018), 200 mg (03/22/2018), 150 mg (02/08/2018), 100 mg (03/08/2018) cyclophosphamide (CYTOXAN) 1,140 mg in sodium chloride 0.9 % 250 mL chemo infusion, 600 mg/m2, Intravenous,  Once, 4 of 4 cycles Administration: 1,140 mg (12/05/2017) PACLitaxel (TAXOL) 150 mg in dextrose 5 % 250 mL chemo infusion (</= 72m/m2), 80 mg/m2 = 150 mg, Intravenous,  Once, 4 of 4 cycles Administration: 150 mg (12/20/2017), 150 mg (12/27/2017), 150 mg (01/17/2018), 150 mg (  01/10/2018), 150 mg (03/15/2018), 150 mg (01/25/2018), 150 mg (02/15/2018), 150 mg (02/22/2018), 150 mg (03/01/2018), 150 mg (03/22/2018), 150 mg (02/08/2018), 150 mg (03/08/2018) fosaprepitant (EMEND) 150 mg, dexamethasone (DECADRON) 12 mg in sodium chloride 0.9 % 145 mL IVPB, , Intravenous,  Once, 5 of 5  cycles Administration:  (12/20/2017),  (12/05/2017)  for chemotherapy treatment.     10/24/2017 -  Neo-Adjuvant Chemotherapy    Dose dense AC for 4 cycles followed by weekly carboplatin and paclitaxel for 12 weeks    06/19/2018 Cancer Staging    Staging form: Breast, AJCC 8th Edition - Pathologic stage from 06/19/2018: Stage IV (pN3a, cM1, G3, ER-, PR-, HER2-) - Signed by Brenda Jack, MD on 06/19/2018      CANCER STAGING: Cancer Staging Malignant neoplasm of upper-outer quadrant of left breast in female, estrogen receptor negative ([place]) Staging form: Breast, AJCC 8th Edition - Clinical: Stage IIIC (cT1, cN2, cM0, G3, ER: Negative, PR: Negative, HER2: Negative) - Signed by Brenda Bouche, NP on 11/28/2017 - Pathologic stage from 06/19/2018: Stage IV (pN3a, cM1, G3, ER-, PR-, HER2-) - Signed by Brenda Jack, MD on 06/19/2018    INTERVAL HISTORY:  Brenda Richmond 67 y.o. female returns for routine follow-up for metastatic breast cancer. Patient is here today with her son. She is having some pain in her left upper chest/shoulder. It is worse at night when she is laying down. She is still having numbness and tingling in her fingers and toes but it is not constant and only in certain positions. Her appetite is 100% and she is maintaining her weight well. Her energy level is almost 1005. She denies any nausea, vomiting, or diarrhea.    REVIEW OF SYSTEMS:  Review of Systems  Gastrointestinal: Positive for constipation.  All other systems reviewed and are negative.    PAST MEDICAL/SURGICAL HISTORY:  Past Medical History:  Diagnosis Date  . Breast cancer, left breast ([place]) 2019  . H/O seasonal allergies   . Heart murmur   . High cholesterol   . Hypertension   . Melanoma ([place])    left posterior forearm  . PONV (postoperative nausea and vomiting)   . Thrombosis    in right arm developed after 2nd round of chemotherapy   Past Surgical History:  Procedure Laterality Date  .  BREAST BIOPSY Left 09/2017  . BREAST CYST EXCISION Left ~ 2004  . BUNIONECTOMY Bilateral   . MASTECTOMY MODIFIED RADICAL Left 04/19/2018  . MASTECTOMY MODIFIED RADICAL Left 04/19/2018   Procedure: LEFT MODIFIED RADICAL MASTECTOMY;  Surgeon: Brenda Bookbinder, MD;  Location: [facility];  Service: General;  Laterality: Left;  Marland Kitchen MELANOMA EXCISION Left    posterior forearm  . PORT-A-CATH REMOVAL Right 04/19/2018   Procedure: REMOVAL PORT-A-CATH;  Surgeon: Brenda Bookbinder, MD;  Location: [facility];  Service: General;  Laterality: Right;  . PORTA CATH INSERTION Right   . PORTA CATH REMOVAL Right 04/19/2018  . TUBAL LIGATION       SOCIAL HISTORY:  Social History   Socioeconomic History  . Marital status: Divorced    Spouse name: Not on file  . Number of children: Not on file  . Years of education: Not on file  . Highest education level: Not on file  Occupational History  . Not on file  Social Needs  . Financial resource strain: Not on file  . Food insecurity:    Worry: Not on file    Inability: Not on file  . Transportation needs:    Medical:  Not on file    Non-medical: Not on file  Tobacco Use  . Smoking status: Never Smoker  . Smokeless tobacco: Never Used  Substance and Sexual Activity  . Alcohol use: Never    Frequency: Never  . Drug use: Never  . Sexual activity: Not on file  Lifestyle  . Physical activity:    Days per week: Not on file    Minutes per session: Not on file  . Stress: Not on file  Relationships  . Social connections:    Talks on phone: Not on file    Gets together: Not on file    Attends religious service: Not on file    Active member of club or organization: Not on file    Attends meetings of clubs or organizations: Not on file    Relationship status: Not on file  . Intimate partner violence:    Fear of current or ex partner: Not on file    Emotionally abused: Not on file    Physically abused: Not on file    Forced sexual activity: Not on file  Other  Topics Concern  . Not on file  Social History Narrative  . Not on file    FAMILY HISTORY:  History reviewed. No pertinent family history.  CURRENT MEDICATIONS:  Outpatient Encounter Medications as of 06/19/2018  Medication Sig  . amLODipine (NORVASC) 5 MG tablet Take 5 mg by mouth at bedtime.   Marland Kitchen atorvastatin (LIPITOR) 10 MG tablet   . Calcium-Magnesium-Vitamin D (CALCIUM 1200+D3 PO) Take 2 tablets by mouth daily with lunch.  . capecitabine (XELODA) 500 MG tablet Take 4 tablets (2,000 mg total) by mouth 2 (two) times daily after a meal. Take for 14 days on, then 7 days off  . fexofenadine (ALLEGRA) 180 MG tablet Take 180 mg by mouth daily as needed for allergies or rhinitis.  . hydrochlorothiazide (HYDRODIURIL) 25 MG tablet Take 25 mg by mouth daily.   Marland Kitchen KLOR-CON M20 20 MEQ tablet Take 1 tablet (20 mEq total) by mouth daily.  . Multiple Vitamin (MULTIVITAMIN WITH MINERALS) TABS tablet Take 1 tablet by mouth daily with lunch.  . polyethylene glycol (MIRALAX / GLYCOLAX) packet Take 17 g by mouth daily as needed for moderate constipation.  . ramipril (ALTACE) 5 MG capsule Take 5 mg by mouth daily.   . Vitamin D, Ergocalciferol, (DRISDOL) 50000 units CAPS capsule Take 50,000 Units by mouth every Monday.   . [DISCONTINUED] KLOR-CON M20 20 MEQ tablet Take 20 mEq by mouth daily.   No facility-administered encounter medications on file as of 06/19/2018.     ALLERGIES:  Allergies  Allergen Reactions  . Penicillin G Rash and Other (See Comments)    PATIENT HAS HAD A PCN REACTION WITH IMMEDIATE RASH, FACIAL/TONGUE/THROAT SWELLING, SOB, OR LIGHTHEADEDNESS WITH HYPOTENSION:  #  #  YES  #  #  Has patient had a PCN reaction causing severe rash involving mucus membranes or skin necrosis: No PATIENT HAS HAD A PCN REACTION THAT REQUIRED HOSPITALIZATION:  #  #  YES  #  #  Has patient had a PCN reaction occurring within the last 10 years: No   . Latex Rash     PHYSICAL EXAM:  ECOG Performance  status: 1  Vitals:   06/19/18 0901  BP: (!) 144/79  Pulse: 89  Resp: 18  Temp: 98.3 F (36.8 C)  SpO2: 100%   Filed Weights   06/19/18 0901  Weight: 183 lb 12.8 oz (83.4 kg)  Physical Exam  Constitutional: She is oriented to person, place, and time. She appears well-developed and well-nourished.  Musculoskeletal: Normal range of motion.  Neurological: She is alert and oriented to person, place, and time.  Skin: Skin is warm and dry.  Psychiatric: She has a normal mood and affect. Her behavior is normal. Judgment and thought content normal.     LABORATORY DATA:  I have reviewed the labs as listed.  CBC    Component Value Date/Time   WBC 2.7 (L) 05/17/2018 0933   RBC 4.44 05/17/2018 0933   HGB 12.4 05/17/2018 0933   HGB 11.6 03/20/2018 1313   HCT 39.0 05/17/2018 0933   PLT 233 05/17/2018 0933   PLT 202 03/20/2018 1313   MCV 87.8 05/17/2018 0933   MCH 27.9 05/17/2018 0933   MCHC 31.8 05/17/2018 0933   RDW 13.5 05/17/2018 0933   LYMPHSABS 0.8 05/17/2018 0933   MONOABS 0.4 05/17/2018 0933   EOSABS 0.1 05/17/2018 0933   BASOSABS 0.0 05/17/2018 0933   CMP Latest Ref Rng & Units 05/17/2018 04/20/2018 04/15/2018  Glucose 70 - 99 mg/dL 95 109(H) 101(H)  BUN 8 - 23 mg/dL 13 <5(L) 11  Creatinine 0.44 - 1.00 mg/dL 0.63 0.75 0.71  Sodium 135 - 145 mmol/L 140 138 140  Potassium 3.5 - 5.1 mmol/L 4.2 3.4(L) 4.1  Chloride 98 - 111 mmol/L 103 102 100  CO2 22 - 32 mmol/L _0 Calcium 8.9 - 10.3 mg/dL 10.3 9.4 10.3  Total Protein 6.5 - 8.1 g/dL 7.3 - -  Total Bilirubin 0.3 - 1.2 mg/dL 1.1 - -  Alkaline Phos 38 - 126 U/L 68 - -  AST 15 - 41 U/L 20 - -  ALT 0 - 44 U/L 20 - -       DIAGNOSTIC IMAGING:  I have independently reviewed images of the PET CT scan and discussed with the patient and her son.     ASSESSMENT & PLAN:   Goals of care, counseling/discussion We discussed the progression to stage IV disease and treatment in the palliative setting.  Malignant  neoplasm of upper-outer quadrant of left breast in female, estrogen receptor negative ([place]) 1.  Stage IV left breast TNBC: - She was initially evaluated by [facility]Chirla in [facility], on 10/08/2017 when she was found to have left breast mass for 3 months, mammogram on 09/26/2017 showed 1.5 x 1.6 cm left axillary tail mass along with 2.9 x 2.2 cm mass in the left axillary area.  Ultrasound-guided biopsy of both showed metastatic high-grade infiltrating ductal carcinoma.  CT scan of the chest, abdomen and pelvis did not reveal any clear metastatic disease prior to start of neoadjuvant chemotherapy. - She completed 4 cycles of dose dense AC on 12/05/2017 - 12 cycles of weekly paclitaxel completed on 03/22/2018. - We reviewed the results of the PET CT scan dated 06/17/2018 which showed PET positivity in the left chest wall musculature, evidence of metastatic adenopathy in the left axilla, internal mammary nodes and upper abdominal, periaortic and periportal nodes.  Multiple hepatic metastasis and innumerable skeletal metastasis were seen. - Unfortunately she has developed metastatic disease while she was receiving neoadjuvant chemotherapy.  She is completely asymptomatic at this time.  We have sent her tumor for PDL 1 testing. - If she is positive, I have recommended combination therapy with Atezolizumab and Abraxane which shows a response rates of close to 60%.  Median overall survival was also 10 months longer for PDL 1+ patients.  We talked about the regimen and side effects in detail. -I will reach out to my colleagues at [facility] and [facility] to see if they have any better clinical trials available.  Patient is agreeable to see the specialist there if they have a good trials. -She will also require port placement.  We will make a referral.  2.  Genetic testing: She had extensive family history of malignancies.  We have done genetic testing for commonly inheritable genetic mutations, it was reported  negative.   3.  Right upper extremity DVT: -She was diagnosed with right upper extremity DVT when she had port placed.  This happened end of February. -She was treated with Eliquis until the port was removed during her breast surgery.  She is off of Eliquis at this time.  4.  Peripheral neuropathy: - She has on and off tingling in the feet which lasts only few minutes.  This is likely from paclitaxel.       Orders placed this encounter:  Orders Placed This Encounter  Procedures  . IR IMAGING GUIDED PORT INSERTION  . CBC with Differential/Platelet  . Comprehensive metabolic panel      Brenda Jack, MD [place] [phone]

## 2018-06-20 ENCOUNTER — Other Ambulatory Visit (HOSPITAL_COMMUNITY): Payer: Managed Care, Other (non HMO)

## 2018-06-20 ENCOUNTER — Encounter (HOSPITAL_COMMUNITY): Payer: Managed Care, Other (non HMO)

## 2018-06-20 NOTE — Telephone Encounter (Signed)
Oral Oncology Patient Advocate Encounter  Called [facility] to check the order status of Xeloda.  Representative states that medication is scheduled to be shipped 06/26/18 per patient request.    Copay is $0.00  [place] Patient [place] Phone [phone] Fax [phone] 06/20/2018 9:46 AM

## 2018-06-21 NOTE — Telephone Encounter (Signed)
Oral Oncology Patient Advocate Encounter  Spoke with patients daughter, Levander Campion, to confirm that patient will not be taking Xeloda.  I have called [facility] and cancelled the prescription and delivery of the medication.  [place] Patient [place] Phone [phone] Fax [phone] 06/21/2018 4:27 PM

## 2018-06-24 ENCOUNTER — Other Ambulatory Visit: Payer: Self-pay | Admitting: Pharmacist

## 2018-06-24 ENCOUNTER — Other Ambulatory Visit (HOSPITAL_COMMUNITY): Payer: Self-pay | Admitting: Hematology

## 2018-06-24 ENCOUNTER — Other Ambulatory Visit (HOSPITAL_COMMUNITY): Payer: Self-pay | Admitting: Pharmacist

## 2018-06-24 ENCOUNTER — Other Ambulatory Visit: Payer: Self-pay | Admitting: Student

## 2018-06-24 ENCOUNTER — Ambulatory Visit (HOSPITAL_COMMUNITY): Payer: Managed Care, Other (non HMO) | Admitting: Hematology

## 2018-06-24 ENCOUNTER — Other Ambulatory Visit (HOSPITAL_COMMUNITY): Payer: Managed Care, Other (non HMO)

## 2018-06-24 NOTE — Progress Notes (Signed)
DISCONTINUE ON PATHWAY REGIMEN - Breast     A cycle is every 14 days (cycles 1-4):     Doxorubicin      Cyclophosphamide      Pegfilgrastim-xxxx    A cycle is every 21 days (cycles 5-8):     Paclitaxel      Carboplatin   **Always confirm dose/schedule in your pharmacy ordering system**  REASON: Disease Progression PRIOR TREATMENT: [id number]: Dose-Dense AC [Doxorubicin + Cyclophosphamide q14 Days x 4 Cycles], Followed by Paclitaxel 80 mg/m2 Weekly + Carboplatin AUC=6 q21 Days x 12 Weeks TREATMENT RESPONSE: Progressive Disease (PD)  START ON PATHWAY REGIMEN - Breast     A cycle is every 28 days:     Atezolizumab      Nab-paclitaxel (protein bound)   **Always confirm dose/schedule in your pharmacy ordering system**  Patient Characteristics: Distant Metastases or Locoregional Recurrent Disease - Unresected or Locally Advanced Unresectable Disease Progressing after Neoadjuvant and Local Therapies, HER2 Negative/Unknown/Equivocal, ER Negative/Unknown, Chemotherapy, First Line, ER Negative,  PD-L1 Expression Positive Therapeutic Status: Distant Metastases BRCA Mutation Status: Absent ER Status: Negative (-) HER2 Status: Negative (-) PR Status: Negative (-) Line of Therapy: First Line PD-L1 Expression Status: PD-L1 Positive Intent of Therapy: Non-Curative / Palliative Intent, Discussed with Patient

## 2018-06-25 ENCOUNTER — Telehealth (HOSPITAL_COMMUNITY): Payer: Self-pay | Admitting: Hematology

## 2018-06-25 ENCOUNTER — Encounter (HOSPITAL_COMMUNITY): Payer: Self-pay | Admitting: *Deleted

## 2018-06-25 ENCOUNTER — Encounter (HOSPITAL_COMMUNITY): Payer: Self-pay | Admitting: Lab

## 2018-06-25 ENCOUNTER — Encounter (HOSPITAL_COMMUNITY): Payer: Self-pay

## 2018-06-25 ENCOUNTER — Ambulatory Visit (HOSPITAL_COMMUNITY)
Admission: RE | Admit: 2018-06-25 | Discharge: 2018-06-25 | Disposition: A | Discharge status: Home / Self Care | Payer: Managed Care, Other (non HMO) | Source: Ambulatory Visit | Attending: Nurse Practitioner | Admitting: Nurse Practitioner

## 2018-06-25 DIAGNOSIS — C787 Secondary malignant neoplasm of liver and intrahepatic bile duct: Secondary | ICD-10-CM | POA: Insufficient documentation

## 2018-06-25 DIAGNOSIS — I10 Essential (primary) hypertension: Secondary | ICD-10-CM | POA: Diagnosis not present

## 2018-06-25 DIAGNOSIS — C50912 Malignant neoplasm of unspecified site of left female breast: Secondary | ICD-10-CM | POA: Diagnosis not present

## 2018-06-25 DIAGNOSIS — E7800 Pure hypercholesterolemia, unspecified: Secondary | ICD-10-CM | POA: Insufficient documentation

## 2018-06-25 DIAGNOSIS — Z880 Allergy status to penicillin: Secondary | ICD-10-CM | POA: Diagnosis not present

## 2018-06-25 DIAGNOSIS — C773 Secondary and unspecified malignant neoplasm of axilla and upper limb lymph nodes: Secondary | ICD-10-CM | POA: Diagnosis not present

## 2018-06-25 DIAGNOSIS — C7951 Secondary malignant neoplasm of bone: Secondary | ICD-10-CM | POA: Insufficient documentation

## 2018-06-25 DIAGNOSIS — Z91040 Latex allergy status: Secondary | ICD-10-CM | POA: Diagnosis not present

## 2018-06-25 DIAGNOSIS — C50412 Malignant neoplasm of upper-outer quadrant of left female breast: Secondary | ICD-10-CM

## 2018-06-25 DIAGNOSIS — Z171 Estrogen receptor negative status [ER-]: Secondary | ICD-10-CM

## 2018-06-25 HISTORY — PX: IR IMAGING GUIDED PORT INSERTION: IMG5740

## 2018-06-25 LAB — CBC
HEMATOCRIT: 43.7 % (ref 36.0–46.0)
Hemoglobin: 13.3 g/dL (ref 12.0–15.0)
MCH: 26.2 pg (ref 26.0–34.0)
MCHC: 30.4 g/dL (ref 30.0–36.0)
MCV: 86.2 fL (ref 80.0–100.0)
Platelets: 284 10*3/uL (ref 150–400)
RBC: 5.07 MIL/uL (ref 3.87–5.11)
RDW: 12.1 % (ref 11.5–15.5)
WBC: 3.6 10*3/uL — AB (ref 4.0–10.5)
nRBC: 0 % (ref 0.0–0.2)

## 2018-06-25 LAB — APTT: aPTT: 23 seconds — ABNORMAL LOW (ref 24–36)

## 2018-06-25 LAB — PROTIME-INR
INR: 1.02
Prothrombin Time: 13.4 seconds (ref 11.4–15.2)

## 2018-06-25 MED ORDER — VANCOMYCIN HCL IN DEXTROSE 1-5 GM/200ML-% IV SOLN
1000.0000 mg | Freq: Once | INTRAVENOUS | Status: AC
  Administered 2018-06-25: 1000 mg via INTRAVENOUS

## 2018-06-25 MED ORDER — VANCOMYCIN HCL IN DEXTROSE 1-5 GM/200ML-% IV SOLN
INTRAVENOUS | Status: AC
  Filled 2018-06-25: qty 200

## 2018-06-25 MED ORDER — PROCHLORPERAZINE MALEATE 10 MG PO TABS: 10.0000 mg | ORAL_TABLET | Freq: Four times a day (QID) | ORAL | 0 refills | Status: AC | PRN

## 2018-06-25 MED ORDER — FENTANYL CITRATE (PF) 100 MCG/2ML IJ SOLN
INTRAMUSCULAR | Status: AC
  Filled 2018-06-25: qty 2

## 2018-06-25 MED ORDER — MIDAZOLAM HCL 2 MG/2ML IJ SOLN
INTRAMUSCULAR | Status: AC | PRN
  Administered 2018-06-25: 1 mg via INTRAVENOUS

## 2018-06-25 MED ORDER — FENTANYL CITRATE (PF) 100 MCG/2ML IJ SOLN
INTRAMUSCULAR | Status: AC | PRN
  Administered 2018-06-25: 50 ug via INTRAVENOUS
  Administered 2018-06-25: 25 ug via INTRAVENOUS

## 2018-06-25 MED ORDER — SODIUM CHLORIDE 0.9 % IV SOLN: INTRAVENOUS | Status: DC

## 2018-06-25 MED ORDER — LIDOCAINE-PRILOCAINE 2.5-2.5 % EX CREA: TOPICAL_CREAM | CUTANEOUS | 3 refills | Status: AC

## 2018-06-25 MED ORDER — LIDOCAINE HCL 1 % IJ SOLN
INTRAMUSCULAR | Status: AC
  Filled 2018-06-25: qty 20

## 2018-06-25 MED ORDER — LIDOCAINE HCL (PF) 1 % IJ SOLN
INTRAMUSCULAR | Status: AC | PRN
  Administered 2018-06-25: 10 mL

## 2018-06-25 MED ORDER — HEPARIN SOD (PORK) LOCK FLUSH 100 UNIT/ML IV SOLN
INTRAVENOUS | Status: AC
  Filled 2018-06-25: qty 5

## 2018-06-25 MED ORDER — MIDAZOLAM HCL 2 MG/2ML IJ SOLN
INTRAMUSCULAR | Status: AC
  Filled 2018-06-25: qty 2

## 2018-06-25 NOTE — Procedures (Signed)
Interventional Radiology Procedure Note  Procedure: Single Lumen Power Port Placement    Access:  Right IJ vein.  Findings: Catheter tip positioned at SVC/RA junction. Port is ready for immediate use.   Complications: None  EBL: < 10 mL  Recommendations:  - Ok to shower in 24 hours - Do not submerge for 7 days - Routine line care   Nikitas Davtyan T. Khalidah Herbold, M.D Pager:  [phone]   

## 2018-06-25 NOTE — Progress Notes (Unsigned)
Referral sent to [facility] Dr Wendee Beavers. Records faxed on 10/15

## 2018-06-25 NOTE — Patient Instructions (Signed)
[facility]   You have been diagnosed with Stage IV metastatic breast cancer. We are going to be treating you with palliative intent, which means that your cancer is treatable but not curable.  We will be treating you with Abraxane (Nab-paclitaxel protein bound) and Tecentriq (Atezolizumab). This will be given through your port a cath.  The Abraxane will be given on days 1,8,15 every 28 days and the Tecentriq will be given on days 1 and 15 every 28 days.  You will see the doctor regularly throughout treatment.  We monitor your lab work prior to every treatment. The doctor monitors your response to treatment by the way you are feeling, your blood work, and scans periodically.  There will be wait times while you are here for treatment.  It will take about 30 minutes to 1 hour for your lab work to result.  Then there will be wait times while pharmacy mixes your medications.   You will be given the following premedications prior to each treatment: Zofran - Antiemetic to help control chemotherapy induced nausea Decadron - steroid given to help prevent any allergic reactions to the chemotherapy.     Atezolizumab Gildardo Pounds)  About This Drug Huey Bienenstock is used to treat cancer. It is given by the vein (IV).  Possible Side Effects . Nausea . Tiredness and weakness . Decreased appetite (decreased hunger) . Cough . Trouble breathing  Note: Each of the side effects above was reported in 20% or greater of patients treated with atezolizumab. Your side effects may be different if you are taking atezolizumab in combination with another agent. Not all possible side effects are included above.  Warnings and Precautions . This drug works with your immune system and can cause inflammation (swelling) in any of your organs and tissues and can change how they work. This may put you at risk for developing serious medical problems, which can be life-threatening. .  Inflammation of the lungs which can be life-threatening. You may have a dry cough or trouble breathing. . Severe changes in your liver function which can cause liver failure and be life-threatening. . Colitis which is swelling in the colon. The symptoms are diarrhea, stomach cramping, and sometimes blood in the bowel movements. . Changes in your central nervous system can happen. The central nervous system is made up of your brain and spinal cord. You could feel extreme tiredness, agitation, confusion, hallucinations (see or hear things that are not there), trouble understanding or speaking, loss of control of your bowels or bladder, eyesight changes, numbness or lack of strength to your arms, legs, face, or body, and coma. If you start to have any of these symptoms let your doctor know right away. . This drug may affect your hormone glands (thyroid, adrenals, pituitary and pancreas). . Blood sugar levels may change, and you may develop diabetes. If you already have diabetes, changes may need to be made to your diabetes medication. . Severe infections, including viral, bacterial and fungal, which can be life-threatening . While you are getting this drug in your vein (IV), you may have a reaction to the drug. Sometimes you may be given medication to stop or lessen these side effects. Your nurse will check you closely for these signs: fever or shaking chills, flushing, facial swelling, feeling dizzy, headache, trouble breathing, rash, itching, chest tightness, or chest pain. These reactions may happen after your infusion. If this happens, call 911 for emergency care.  Note: Some of the side effects  above are very rare. If you have concerns and/or questions, please discuss them with your medical team.  Important Information . This drug may be present in the saliva, tears, sweat, urine, stool, vomit, semen, and vaginal secretions. Talk to your doctor and/or your nurse about the necessary  precautions to take during this time.  Treating Side Effects . Manage tiredness by pacing your activities for the day. . Be sure to include periods of rest between energy-draining activities. . To decrease the risk of infection, wash your hands regularly. . Avoid close contact with people who have a cold, the flu, or other infections. . Take your temperature as your doctor or nurse tells you, and whenever you feel like you may have a fever. . Drink plenty of fluids (a minimum of eight glasses per day is recommended). . Ask your doctor or nurse about medicine that is available to help stop or lessen loose bowel movements. . To help with nausea, eat small, frequent meals instead of three large meals a day. Choose foods and drinks that are at room temperature. Ask your nurse or doctor about other helpful tips and medicine that is available to help stop or lessen these symptoms. . To help with decreased appetite, eat small, frequent meals. Eat foods high in calories and protein, such as meat, poultry, fish, dry beans, tofu, eggs, nuts, milk, yogurt, cheese, ice cream, pudding, and nutritional supplements. . Consider using sauces and spices to increase taste. Daily exercise, with your doctor's approval, may increase your appetite. . If you have diabetes, keep good control of your blood sugar level. Tell your nurse or your doctor if your glucose levels are higher or lower than normal. . Keeping your pain under control is important to your well-being. Please tell your doctor or nurse if you are experiencing pain. . If you get a rash do not put anything on it unless your doctor or nurse says you may. Keep the area around the rash clean and dry. Ask your doctor for medicine if your rash bothers you. . If you have numbness and tingling in your hands and feet, be careful when cooking, walking, and handling sharp objects and hot liquids. . Infusion reactions may happen after your infusion. If this  happens, call 911 for emergency care.  Food and Drug Interactions . There are no known interactions of atezolizumab with food. . This drug may interact with other medicines. Tell your doctor and pharmacist about all the prescription and over-the-counter medicines and dietary supplements (vitamins, minerals, herbs and others) that you are taking at this time. Also, check with your doctor or pharmacist before starting any new prescription or over-the-counter medicines, or dietary supplements to make sure that there are no interactions.  When to Call the Doctor Call your doctor or nurse if you have any of these symptoms and/or any new or unusual symptoms: . Fever of 100.4 F (38 C) or higher . Chills . Tiredness that interferes with your daily activities . Feeling dizzy or lightheaded . Pain in your chest . Dry cough . Coughing up yellow, green, or bloody mucus . Wheezing or trouble breathing . Feeling that your heart is beating in a fast or not normal way (palpitations) . Confusion and/or agitation . Hallucinations . Trouble understanding or speaking . Numbness or lack of strength to your arms, legs, face, or body . Blurred vision or other changes in eyesight . Diarrhea, 4 times in one day or diarrhea with lack of strength or a feeling  of being dizzy . Pain in your abdomen that does not go away . Blood in your stool . Nausea that stops you from eating or drinking and/or is not relieved by prescribed medicines . Throwing up more than 3 times a day . Lasting loss of appetite or rapid weight loss of five pounds in a week . Abnormal blood sugar . Unusual thirst, passing urine often, headache, sweating, shakiness, irritability . Pain that does not go away, or is not relieved by prescribed medicines . Numbness, tingling, or pain your hands and feet . Extreme weakness that interferes with normal activities . A new rash or a rash that is not relieved by prescribed medicines . Signs of  infusion reaction: fever or shaking chills, flushing, facial swelling, feeling dizzy, headache, trouble breathing, rash, itching, chest tightness, or chest pain. If this happens, call 911 for emergency care. . Signs of possible liver problems: dark urine, pale bowel movements, bad stomach pain, feeling very tired and weak, unusual itching, or yellowing of the eyes or skin . If you think you may be pregnant  Reproduction Warnings . Pregnancy warning: This drug can have harmful effects on the unborn baby. Women of childbearing potential should use effective methods of birth control during your cancer treatment and for at least 5 months after treatment. Let your doctor know right away if you think you may be pregnant. . Breastfeeding warning: It is not known if this drug passes into breast milk. For this reason, Women should not breastfeed during treatment and for at least 5 months after treatment because this drug could enter the breast milk and cause harm to a breastfeeding baby. . Fertility warning: In women, this drug may affect your ability to have children in the future. Talk with your doctor or nurse if you plan to have children. Ask for information on egg banking.   Nab-paclitaxel (protein-bound) (Abraxane)  About This Drug Nab-paclitaxel is used to treat cancer. It is given in the vein (IV).  Possible Side Effects . Bone marrow suppression. This is a decrease in the number of white blood cells, red blood cells, and platelets. This may raise your risk of infection, make you tired and weak (fatigue), and raise your risk of bleeding. . Abnormal heart beat, blood pressure, and/or abnormal EKG (electrocardiogram) . Nausea and vomiting (throwing up) . Diarrhea (loose bowel movements) . Tiredness, weakness . Swelling of your legs, ankles and/or feet . Fever . Changes in your liver function . Infection . Dehydration (lack of water in the body from losing too much fluid) .  Decreased appetite (decreased hunger) . Joint, bone and muscle pain . Rash . Effects on the nerves are called peripheral neuropathy. You may feel numbness, tingling, or pain in your hands and feet. It may be hard for you to button your clothes, open jars, or walk as usual. The effect on the nerves may get worse with more doses of the drug. These effects get better in some people after the drug is stopped but it does not get better in all people. . Hair loss. Hair loss is often temporary, although with certain medicine, hair loss can sometimes be permanent. Hair loss may happen suddenly or gradually. If you lose hair, you may lose it from your head, face, armpits, pubic area, chest, and/or legs. You may also notice your hair getting thin.  Note: Each of the side effects above was reported in 20% or greater of patients treated with nabpaclitaxel. Not all possible side effects  are included above.  Warnings and Precautions . Severe bone marrow suppression . Inflammation (swelling) of the lungs. You may have a dry cough or trouble breathing. . Severe infections which can be life-threatening . Severe peripheral neuropathy . Allergic reactions, including anaphylaxis are rare but may happen in some patients, which can be life-threatening. Signs of allergic reaction to this drug may be swelling of the face, feeling like your tongue or throat are swelling, trouble breathing, rash, itching, fever, chills, feeling dizzy, and/or feeling that your heart is beating in a fast or not normal way. If this happens, do not take another dose of this drug. You should get urgent medical treatment. . This drug contains albumin, which is a protein from donated human blood. There is a rare risk of transmission of viral diseases.  Note: Some of the side effects above are very rare. If you have concerns and/or questions, please discuss them with your medical team.  Important Information . This drug may be present  in the saliva, tears, sweat, urine, stool, vomit, semen, and vaginal secretions. Talk to your doctor and/or your nurse about the necessary precautions to take during this time.  Treating Side Effects . To decrease the risk of infection, wash your hands regularly. . Avoid close contact with people who have a cold, the flu, or other infections. . Take your temperature as your doctor or nurse tells you, and whenever you feel like you may have a fever. . To help decrease the risk of bleeding, use a soft toothbrush. Check with your nurse before using dental floss. . Be very careful when using knives or tools. . Use an electric shaver instead of a razor. . Manage tiredness by pacing your activities for the day. Be sure to include periods of rest between energy-draining activities. . Drink plenty of fluids (a minimum of eight glasses per day is recommended). . To help with decreased appetite, eat small, frequent meals. Eat foods high in calories and protein, such as meat, poultry, fish, dry beans, tofu, eggs, nuts, milk, yogurt, cheese, ice cream, pudding, and nutritional supplements. . Consider using sauces and spices to increase taste. Daily exercise, with your doctor's approval, may increase your appetite. . If you throw up or have loose bowel movements, you should drink more fluids so that you do not become dehydrated (lack of water in the body from losing too much fluid). . To help with nausea and vomiting, eat small, frequent meals instead of three large meals a day. Choose foods and drinks that are at room temperature. Ask your nurse or doctor about other helpful tips and medicine that is available to help stop or lessen these symptoms. . If you have diarrhea, eat low-fiber foods that are high in protein and calories and avoid foods that can irritate your digestive tracts or lead to cramping. . Ask your nurse or doctor about medicine that can lessen or stop your diarrhea. . To help with  hair loss, wash hair with a mild shampoo and avoid washing your hair every day. . Avoid rubbing your scalp, pat your hair or scalp dry. . Avoid coloring your hair. . Limit your use of hair spray, electric curlers, blow dryers, and curling irons. . If you are interested in getting a wig, talk to your nurse. You can also call the [employer] at 800-ACS-2345 to find out information about the "Look Good, Feel Better" program close to where you live. It is a free program where women getting chemotherapy  can learn about wigs, turbans and scarves as well as makeup techniques and skin and nail care. Marland Kitchen Keeping your pain under control is important to your well-being. Please tell your doctor or nurse if you are experiencing pain. . If you get a rash do not put anything on it unless your doctor or nurse says you may. Keep the area around the rash clean and dry. Ask your doctor for medicine if your rash bothers you. . If you have numbness and tingling in your hands and feet, be careful when cooking, walking, and handling sharp objects and hot liquids.  Food and Drug Interactions . There are no known interactions of nab-paclitaxel of with food. . This drug may interact with other medicines. Tell your doctor and pharmacist about all the prescription and over-the-counter medicines and dietary supplements (vitamins, minerals, herbs and others) that you are taking at this time. Also, check with your doctor or pharmacist before starting any new prescription or over-the-counter medicines, or dietary supplements to make sure that there are no interactions.  When to Call the Doctor Call your doctor or nurse if you have any of these symptoms and/or any new or unusual symptoms: . Fever of 100.4 F (38 C) or higher . Chills . Signs of a local infection such as pain, redness, tenderness, warmth and/or swelling . Easy bleeding or bruising . Wheezing or trouble breathing . Tiredness that interferes  with your daily activities . Pain in your chest . Dry cough . Feeling dizzy or lightheaded . Feeling that your heart is beating in a fast or not normal way (palpitations) . Diarrhea, 4 times in one day or diarrhea with weakness or lightheadedness . Nausea that stops you from eating or drinking and/or that isn't relieved by prescribed medicines . Throwing up more than 3 times a day . Lasting loss of appetite or rapid weight loss of five pounds in a week . Pain that does not go away or is not relieved by prescribed medicine . Numbness, tingling, or pain in your hands and feet . Weight gain of 5 pounds in one week (fluid retention) . Swelling of your legs, ankles and/or feet . Signs of liver problems: dark urine, pale bowel movements, bad stomach pain, feeling very tired and weak, unusual itching, or yellowing of the eyes or skin . Signs of allergic reaction: swelling of the face, feeling like your tongue or throat are swelling, trouble breathing, rash, itching, fever, chills, feeling dizzy, and/or feeling that your heart is beating in a fast or not normal way. If this happens, call 911 for emergency care. . New rash and/or itching . Rash that is not relieved by prescribed medicines . If you think you are pregnant or have impregnated your partner  Reproduction Warnings . Pregnancy warning: This drug can have harmful effects on the unborn baby. Women of childbearing potential should use effective methods of birth control during your cancer treatment and for at least 6 months after treatment. Men with female partners of childbearing potential should use effective methods of birth control during your cancer treatment and for at least 3 months after your cancer treatment. Let your doctor know right away if you think you may be pregnant or may have impregnated your partner. . Breastfeeding warning: It is not known if this drug passes into breast milk. For this reason, women should not  breastfeed during treatment and for 2 weeks after treatment because this drug could enter the breast milk and cause harm to a  breastfeeding baby. . Fertility Warning: In men and women both, this drug may affect your ability to have children in the future. Talk with your doctor or nurse if you plan to have children. Ask for information on sperm or egg banking.   SELF CARE ACTIVITIES WHILE ON CHEMOTHERAPY:  Hydration Increase your fluid intake 48 hours prior to treatment and drink at least 8 to 12 cups (64 ounces) of water/decaffeinated beverages per day after treatment. You can still have your cup of coffee or soda but these beverages do not count as part of your 8 to 12 cups that you need to drink daily. No alcohol intake.  Medications Continue taking your normal prescription medication as prescribed.  If you start any new herbal or new supplements please let us know first to make sure it is safe.  Mouth Care Have teeth cleaned professionally before starting treatment. Keep dentures and partial plates clean. Use soft toothbrush and do not use mouthwashes that contain alcohol. Biotene is a good mouthwash that is available at most pharmacies or may be ordered by calling [phone]. Use warm salt water gargles (1 teaspoon salt per 1 quart warm water) before and after meals and at bedtime. Or you may rinse with 2 tablespoons of three-percent hydrogen peroxide mixed in eight ounces of water. If you are still having problems with your mouth or sores in your mouth please call the clinic. If you need dental work, please let the doctor know before you go for your appointment so that we can coordinate the best possible time for you in regards to your chemo regimen. You need to also let your dentist know that you are actively taking chemo. We may need to do labs prior to your dental appointment.  Skin Care Always use sunscreen that has not expired and with SPF (Sun Protection Factor) of 50 or higher.  Wear hats to protect your head from the sun. Remember to use sunscreen on your hands, ears, face, & feet.  Use good moisturizing lotions such as udder cream, eucerin, or even Vaseline. Some chemotherapies can cause dry skin, color changes in your skin and nails.    . Avoid long, hot showers or baths. . Use gentle, fragrance-free soaps and laundry detergent. . Use moisturizers, preferably creams or ointments rather than lotions because the thicker consistency is better at preventing skin dehydration. Apply the cream or ointment within 15 minutes of showering. Reapply moisturizer at night, and moisturize your hands every time after you wash them.  Hair Loss (if your doctor says your hair will fall out)  . If your doctor says that your hair is likely to fall out, decide before you begin chemo whether you want to wear a wig. You may want to shop before treatment to match your hair color. . Hats, turbans, and scarves can also camouflage hair loss, although some people prefer to leave their heads uncovered. If you go bare-headed outdoors, be sure to use sunscreen on your scalp. . Cut your hair short. It eases the inconvenience of shedding lots of hair, but it also can reduce the emotional impact of watching your hair fall out. . Don't perm or color your hair during chemotherapy. Those chemical treatments are already damaging to hair and can enhance hair loss. Once your chemo treatments are done and your hair has grown back, it's OK to resume dyeing or perming hair. With chemotherapy, hair loss is almost always temporary. But when it grows back, it may be a different  color or texture. In older adults who still had hair color before chemotherapy, the new growth may be completely gray.  Often, new hair is very fine and soft.  Infection Prevention Please wash your hands for at least 30 seconds using warm soapy water. Handwashing is the #1 way to prevent the spread of germs. Stay away from sick people or people  who are getting over a cold. If you develop respiratory systems such as green/yellow mucus production or productive cough or persistent cough let us know and we will see if you need an antibiotic. It is a good idea to keep a pair of gloves on when going into grocery stores/[employer] to decrease your risk of coming into contact with germs on the carts, etc. Carry alcohol hand gel with you at all times and use it frequently if out in public. If your temperature reaches 100.5 or higher please call the clinic and let us know.  If it is after hours or on the weekend please go to the ER if your temperature is over 100.5.  Please have your own personal thermometer at home to use.    Sex and bodily fluids If you are going to have sex, a condom must be used to protect the person that isn't taking chemotherapy. Chemo can decrease your libido (sex drive). For a few days after chemotherapy, chemotherapy can be excreted through your bodily fluids.  When using the toilet please close the lid and flush the toilet twice.  Do this for a few day after you have had chemotherapy.   Effects of chemotherapy on your sex life Some changes are simple and won't last long. They won't affect your sex life permanently. Sometimes you may feel: . too tired . not strong enough to be very active . sick or sore  . not in the mood . anxious or low Your anxiety might not seem related to sex. For example, you may be worried about the cancer and how your treatment is going. Or you may be worried about money, or about how you family are coping with your illness. These things can cause stress, which can affect your interest in sex. It's important to talk to your partner about how you feel. Remember - the changes to your sex life don't usually last long. There's usually no medical reason to stop having sex during chemo. The drugs won't have any long term physical effects on your performance or enjoyment of sex. Cancer can't be passed on to  your partner during sex  Contraception It's important to use reliable contraception during treatment. Avoid getting pregnant while you or your partner are having chemotherapy. This is because the drugs may harm the baby. Sometimes chemotherapy drugs can leave a man or woman infertile.  This means you would not be able to have children in the future. You might want to talk to someone about permanent infertility. It can be very difficult to learn that you may no longer be able to have children. Some people find counselling helpful. There might be ways to preserve your fertility, although this is easier for men than for women. You may want to speak to a fertility expert. You can talk about sperm banking or harvesting your eggs. You can also ask about other fertility options, such as donor eggs. If you have or have had breast cancer, your doctor might advise you not to take the contraceptive pill. This is because the hormones in it might affect the cancer.  It is  not known for sure whether or not chemotherapy drugs can be passed on through semen or secretions from the vagina. Because of this some doctors advise people to use a barrier method if you have sex during treatment. This applies to vaginal, anal or oral sex. Generally, doctors advise a barrier method only for the time you are actually having the treatment and for about a week after your treatment. Advice like this can be worrying, but this does not mean that you have to avoid being intimate with your partner. You can still have close contact with your partner and continue to enjoy sex.  Animals If you have cats or birds we just ask that you not change the litter or change the cage.  Please have someone else do this for you while you are on chemotherapy.   Food Safety During and After Cancer Treatment Food safety is important for people both during and after cancer treatment. Cancer and cancer treatments, such as chemotherapy, radiation therapy,  and stem cell/bone marrow transplantation, often weaken the immune system. This makes it harder for your body to protect itself from foodborne illness, also called food poisoning. Foodborne illness is caused by eating food that contains harmful bacteria, parasites, or viruses.  Foods to avoid Some foods have a higher risk of becoming tainted with bacteria. These include: Marland Kitchen Unwashed fresh fruit and vegetables, especially leafy vegetables that can hide dirt and other contaminants . Raw sprouts, such as alfalfa sprouts . Raw or undercooked beef, especially ground beef, or other raw or undercooked meat and poultry . Fatty, fried, or spicy foods immediately before or after treatment.  These can sit heavy on your stomach and make you feel nauseous. . Raw or undercooked shellfish, such as oysters. . Sushi and sashimi, which often contain raw fish.  . Unpasteurized beverages, such as unpasteurized fruit juices, raw milk, raw yogurt, or cider . Undercooked eggs, such as soft boiled, over easy, and poached; raw, unpasteurized eggs; or foods made with raw egg, such as homemade raw cookie dough and homemade mayonnaise Simple steps for food safety Shop smart. . Do not buy food stored or displayed in an unclean area. . Do not buy bruised or damaged fruits or vegetables. . Do not buy cans that have cracks, dents, or bulges. . Pick up foods that can spoil at the end of your shopping trip and store them in a cooler on the way home. Prepare and clean up foods carefully. . Rinse all fresh fruits and vegetables under running water, and dry them with a clean towel or paper towel. . Clean the top of cans before opening them. . After preparing food, wash your hands for 20 seconds with hot water and soap. Pay special attention to areas between fingers and under nails. . Clean your utensils and dishes with hot water and soap. Marland Kitchen Disinfect your kitchen and cutting boards using 1 teaspoon of liquid, unscented bleach  mixed into 1 quart of water.   Dispose of old food. . Eat canned and packaged food before its expiration date (the "use by" or "best before" date). . Consume refrigerated leftovers within 3 to 4 days. After that time, throw out the food. Even if the food does not smell or look spoiled, it still may be unsafe. Some bacteria, such as Listeria, can grow even on foods stored in the refrigerator if they are kept for too long. Take precautions when eating out. . At restaurants, avoid buffets and salad bars where food sits  out for a long time and comes in contact with many people. Food can become contaminated when someone with a virus, often a norovirus, or another "bug" handles it. . Put any leftover food in a "to-go" container yourself, rather than having the server do it. And, refrigerate leftovers as soon as you get home. . Choose restaurants that are clean and that are willing to prepare your food as you order it cooked.   MEDICATIONS:                                                                                                                                                                Compazine/Prochlorperazine 10mg  tablet. Take 1 tablet every 6 hours as needed for nausea/vomiting. (This can make you sleepy)   EMLA cream. Apply a quarter size amount to port site 1 hour prior to chemo. Do not rub in. Cover with plastic wrap.   Over-the-Counter Meds:  Colace - 100 mg capsules - take 2 capsules daily.  If this doesn't help then you can increase to 2 capsules twice daily.  Call us if this does not help your bowels move.   Imodium 2mg  capsule. Take 2 capsules after the 1st loose stool and then 1 capsule every 2 hours until you go a total of 12 hours without having a loose stool. Call the [facility] if loose stools continue. If diarrhea occurs at bedtime, take 2 capsules at bedtime. Then take 2 capsules every 4 hours until morning. Call [facility].    Diarrhea Sheet   If you are  having loose stools/diarrhea, please purchase Imodium and begin taking as outlined:  At the first sign of poorly formed or loose stools you should begin taking Imodium (loperamide) 2 mg capsules.  Take two caplets (4mg ) followed by one caplet (2mg ) every 2 hours until you have had no diarrhea for 12 hours.  During the night take two caplets (4mg ) at bedtime and continue every 4 hours during the night until the morning.  Stop taking Imodium only after there is no sign of diarrhea for 12 hours.    Always call the [facility] if you are having loose stools/diarrhea that you can't get under control.  Loose stools/diarrhea leads to dehydration (loss of water) in your body.  We have other options of trying to get the loose stools/diarrhea to stop but you must let us know!   Constipation Sheet  Colace - 100 mg capsules - take 2 capsules daily.  If this doesn't help then you can increase to 2 capsules twice daily.  Please call if the above does not work for you.   Do not go more than 2 days without a bowel movement.  It is very important that you do not become constipated.  It  will make you feel sick to your stomach (nausea) and can cause abdominal pain and vomiting.   Nausea Sheet   Compazine/Prochlorperazine 10mg  tablet. Take 1 tablet every 6 hours as needed for nausea/vomiting. (This can make you sleepy)  If you are having persistent nausea (nausea that does not stop) please call the [facility] and let us know the amount of nausea that you are experiencing.  If you begin to vomit, you need to call the [facility] and if it is the weekend and you have vomited more than one time and can't get it to stop-go to the Emergency Room.  Persistent nausea/vomiting can lead to dehydration (loss of fluid in your body) and will make you feel terrible.   Ice chips, sips of clear liquids, foods that are @ room temperature, crackers, and toast tend to be better tolerated.   SYMPTOMS TO REPORT AS SOON AS  POSSIBLE AFTER TREATMENT:   FEVER GREATER THAN 100.5 F  CHILLS WITH OR WITHOUT FEVER  NAUSEA AND VOMITING THAT IS NOT CONTROLLED WITH YOUR NAUSEA MEDICATION  UNUSUAL SHORTNESS OF BREATH  UNUSUAL BRUISING OR BLEEDING  TENDERNESS IN MOUTH AND THROAT WITH OR WITHOUT PRESENCE OF ULCERS  URINARY PROBLEMS  BOWEL PROBLEMS  UNUSUAL RASH      Wear comfortable clothing and clothing appropriate for easy access to any Portacath or PICC line. Let us know if there is anything that we can do to make your therapy better!    What to do if you need assistance after hours or on the weekends: CALL [phone].  HOLD on the line, do not hang up.  You will hear multiple messages but at the end you will be connected with a nurse triage line.  They will contact the doctor if necessary.  Most of the time they will be able to assist you.  Do not call the hospital operator.      I have been informed and understand all of the instructions given to me and have received a copy. I have been instructed to call the clinic [phone] or my family physician as soon as possible for continued medical care, if indicated. I do not have any more questions at this time but understand that I may call the [facility] or the Patient Navigator at [phone] during office hours should I have questions or need assistance in obtaining follow-up care.

## 2018-06-25 NOTE — Telephone Encounter (Signed)
[employer] Mellissa Kohut [facility] RN [phone] [email or web address]

## 2018-06-25 NOTE — Progress Notes (Signed)
Patient's daughter called stating that they want to see Dr. Era Skeen at [facility] in [facility].  I have called and left a message with Randel Pigg the breast navigator there at the office.  I gave her the patient's information and advised that we would be sending the referral. I left Maudie Mercury my contact information so that she can call should she have any questions about [facility].

## 2018-06-25 NOTE — Discharge Instructions (Signed)
Implanted Port Insertion, Care After °This sheet gives you information about how to care for yourself after your procedure. Your health care provider may also give you more specific instructions. If you have problems or questions, contact your health care provider. °What can I expect after the procedure? °After your procedure, it is common to have: °· Discomfort at the port insertion site. °· Bruising on the skin over the port. This should improve over 3-4 days. ° °Follow these instructions at home: °Port care °· After your port is placed, you will get a manufacturer's information card. The card has information about your port. Keep this card with you at all times. °· Take care of the port as told by your health care provider. Ask your health care provider if you or a family member can get training for taking care of the port at home. A home health care nurse may also take care of the port. °· Make sure to remember what type of port you have. °Incision care °· Follow instructions from your health care provider about how to take care of your port insertion site. Make sure you: °? Wash your hands with soap and water before you change your bandage (dressing). If soap and water are not available, use hand sanitizer. °? Change your dressing as told by your health care provider. °? Leave stitches (sutures), skin glue, or adhesive strips in place. These skin closures may need to stay in place for 2 weeks or longer. If adhesive strip edges start to loosen and curl up, you may trim the loose edges. Do not remove adhesive strips completely unless your health care provider tells you to do that. °· Check your port insertion site every day for signs of infection. Check for: °? More redness, swelling, or pain. °? More fluid or blood. °? Warmth. °? Pus or a bad smell. °General instructions °· Do not take baths, swim, or use a hot tub until your health care provider approves. °· Do not lift anything that is heavier than 10 lb (4.5  kg) for a week, or as told by your health care provider. °· Ask your health care provider when it is okay to: °? Return to work or school. °? Resume usual physical activities or sports. °· Do not drive for 24 hours if you were given a medicine to help you relax (sedative). °· Take over-the-counter and prescription medicines only as told by your health care provider. °· Wear a medical alert bracelet in case of an emergency. This will tell any health care providers that you have a port. °· Keep all follow-up visits as told by your health care provider. This is important. °Contact a health care provider if: °· You cannot flush your port with saline as directed, or you cannot draw blood from the port. °· You have a fever or chills. °· You have more redness, swelling, or pain around your port insertion site. °· You have more fluid or blood coming from your port insertion site. °· Your port insertion site feels warm to the touch. °· You have pus or a bad smell coming from the port insertion site. °Get help right away if: °· You have chest pain or shortness of breath. °· You have bleeding from your port that you cannot control. °Summary °· Take care of the port as told by your health care provider. °· Change your dressing as told by your health care provider. °· Keep all follow-up visits as told by your health care provider. °  This information is not intended to replace advice given to you by your health care provider. Make sure you discuss any questions you have with your health care provider. °Document Released: 06/18/2013 Document Revised: 07/19/2016 Document Reviewed: 07/19/2016 °[employer] Interactive Patient Education © 2017 [employer] ° °

## 2018-06-25 NOTE — H&P (Signed)
Chief Complaint: Patient was seen in consultation today for port palcement at the request of Jasper L  Referring Physician(s): Lockamy,Randi L  Supervising Physician: Aletta Edouard  Patient Status: [facility] - Out-pt  History of Present Illness: Brenda Richmond is a 67 y.o. female with recurrent left breast cancer. She previously underwent chemotherapy via a (R)subclavian approach port, which was removed a few months ago by her Psychologist, sport and exercise. She is now to restart chemotherapy and IR is asked to place new port. PMHx, meds, labs, imaging, allergies reviewed. Feels well, no recent fevers, chills, illness. Has been NPO today as directed. Family at bedside.   Past Medical History:  Diagnosis Date  . Breast cancer, left breast ([place]) 2019  . H/O seasonal allergies   . Heart murmur   . High cholesterol   . Hypertension   . Melanoma ([place])    left posterior forearm  . PONV (postoperative nausea and vomiting)   . Thrombosis    in right arm developed after 2nd round of chemotherapy    Past Surgical History:  Procedure Laterality Date  . BREAST BIOPSY Left 09/2017  . BREAST CYST EXCISION Left ~ 2004  . BUNIONECTOMY Bilateral   . MASTECTOMY MODIFIED RADICAL Left 04/19/2018  . MASTECTOMY MODIFIED RADICAL Left 04/19/2018   Procedure: LEFT MODIFIED RADICAL MASTECTOMY;  Surgeon: Rolm Bookbinder, MD;  Location: [facility];  Service: General;  Laterality: Left;  Marland Kitchen MELANOMA EXCISION Left    posterior forearm  . PORT-A-CATH REMOVAL Right 04/19/2018   Procedure: REMOVAL PORT-A-CATH;  Surgeon: Rolm Bookbinder, MD;  Location: [facility];  Service: General;  Laterality: Right;  . PORTA CATH INSERTION Right   . PORTA CATH REMOVAL Right 04/19/2018  . TUBAL LIGATION      Allergies: Penicillin g and Latex  Medications: Prior to Admission medications   Medication Sig Start Date End Date Taking? Authorizing Provider  amLODipine (NORVASC) 5 MG tablet Take 5 mg by mouth at bedtime.  08/21/17  Yes  [provider]  Calcium-Magnesium-Vitamin D (CALCIUM 1200+D3 PO) Take 2 tablets by mouth daily with lunch.   Yes [provider]  hydrochlorothiazide (HYDRODIURIL) 25 MG tablet Take 25 mg by mouth daily.  08/21/17  Yes [provider]  Multiple Vitamin (MULTIVITAMIN WITH MINERALS) TABS tablet Take 1 tablet by mouth daily with lunch.   Yes [provider]  polyethylene glycol (MIRALAX / GLYCOLAX) packet Take 17 g by mouth daily as needed for moderate constipation.   Yes [provider]  ramipril (ALTACE) 5 MG capsule Take 5 mg by mouth daily.  08/21/17  Yes [provider]  Vitamin D, Ergocalciferol, (DRISDOL) 50000 units CAPS capsule Take 50,000 Units by mouth every Monday.  08/21/17  Yes [provider]  fexofenadine (ALLEGRA) 180 MG tablet Take 180 mg by mouth daily as needed for allergies or rhinitis.    [provider]     History reviewed. No pertinent family history.  Social History   Socioeconomic History  . Marital status: Divorced    Spouse name: Not on file  . Number of children: Not on file  . Years of education: Not on file  . Highest education level: Not on file  Occupational History  . Not on file  Social Needs  . Financial resource strain: Not on file  . Food insecurity:    Worry: Not on file    Inability: Not on file  . Transportation needs:    Medical: Not on file    Non-medical: Not  on file  Tobacco Use  . Smoking status: Never Smoker  . Smokeless tobacco: Never Used  Substance and Sexual Activity  . Alcohol use: Never    Frequency: Never  . Drug use: Never  . Sexual activity: Not on file  Lifestyle  . Physical activity:    Days per week: Not on file    Minutes per session: Not on file  . Stress: Not on file  Relationships  . Social connections:    Talks on phone: Not on file    Gets together: Not on file    Attends religious service: Not on file    Active member of club or  organization: Not on file    Attends meetings of clubs or organizations: Not on file    Relationship status: Not on file  Other Topics Concern  . Not on file  Social History Narrative  . Not on file     Review of Systems: A 12 point ROS discussed and pertinent positives are indicated in the HPI above.  All other systems are negative.  Review of Systems  Vital Signs: BP 133/85   Pulse 85   Temp 98.3 F (36.8 C) (Oral)   Resp 20   Ht 5' 4.5" (1.638 m)   Wt 81.6 kg   SpO2 97%   BMI 30.42 kg/m   Physical Exam  Constitutional: She is oriented to person, place, and time. She appears well-developed. No distress.  HENT:  Head: Normocephalic.  Mouth/Throat: Oropharynx is clear and moist.  Neck: Normal range of motion. No JVD present. No tracheal deviation present.  Cardiovascular: Normal rate, regular rhythm and normal heart sounds.  Pulmonary/Chest: Effort normal and breath sounds normal.  Abdominal: Soft.  Neurological: She is alert and oriented to person, place, and time.  Skin: Skin is warm and dry.  (R)port removal site is well healed  Psychiatric: She has a normal mood and affect. Judgment normal.      Imaging: Ct Chest W Contrast  Result Date: 06/06/2018 CLINICAL DATA:  67 year old female with a history of left breast cancer with prior left modified radical mastectomy EXAM: CT CHEST, ABDOMEN, AND PELVIS WITH CONTRAST TECHNIQUE: Multidetector CT imaging of the chest, abdomen and pelvis was performed following the standard protocol during bolus administration of intravenous contrast. CONTRAST:  152mL OMNIPAQUE IOHEXOL 300 MG/ML  SOLN COMPARISON:  No prior CT imaging or PET imaging. Final pathology report from the surgical resection 04/19/2018 FINDINGS: CT CHEST FINDINGS Cardiovascular: Heart size within normal size. No pericardial fluid/thickening. No significant calcifications of the coronary arteries. Unremarkable diameter of the pulmonary artery. Unremarkable course  caliber and contour of the thoracic aorta. Minimal atherosclerotic changes of the aortic arch. Branch vessels are patent. Cervical cerebral arteries are patent at the base of the neck. Mediastinum/Nodes: Small lymph nodes of the mediastinum without adenopathy. Unremarkable course of the thoracic esophagus. Lungs/Pleura: No confluent airspace disease. No pneumothorax or pleural effusion. No central airway debris or thickening. There are tiny pulmonary nodules which are nonspecific including: -image 53 series 4 posterior left upper lobe -image 67 series 4, posterior left upper lobe -image 77 series 4 left upper lobe -image 100 series 4 peripheral right lower lobe Musculoskeletal: No acute displaced fracture. Chest wall: Surgical changes of left mastectomy and axillary nodal dissection. Internal enhancement and thickening of the left pectus minor muscle, with questionable thickening of intercostal musculature on image 16 of series 2. There are enlarged subpectoral lymph nodes of the left chest and left axillary  nodes. Inflammatory changes of the left axilla adjacent to the surgical clips. CT ABDOMEN PELVIS FINDINGS Hepatobiliary: Left hepatic lobe is absent or atrophic. No surgical suture lines are present. There are multiple subcentimeter hypodense lesions within the functional liver remnant/right liver lobe. Unremarkable gallbladder. Pancreas: Unremarkable pancreas. Spleen: Unremarkable spleen Adrenals/Urinary Tract: Bilateral adrenal glands unremarkable. Right kidney without hydronephrosis or nephrolithiasis. Small low-density lesion of the lateral right kidney cortex, likely a benign cyst. Left kidney without hydronephrosis or nephrolithiasis. Subcentimeter low-density lesion of the lateral left cortex, likely a benign cyst. Unremarkable course the bilateral ureters. Unremarkable appearance of the urinary bladder. Stomach/Bowel: Hiatal hernia. Unremarkable stomach. Small bowel unremarkable. Normal appendix.  Moderate to large stool burden with otherwise unremarkable colon. Vascular/Lymphatic: No significant vascular disease. Small lymph nodes of the preaortic and periaortic nodal station, none of which are enlarged. Reproductive: Fibroid uterus Other: None Musculoskeletal: Compression fracture of T10 with no comparison. Approximately 50% height loss in the mid vertebral body. Subcentimeter sclerotic foci of the T8, T9, T10, and L1 vertebral bodies. IMPRESSION: Given the available history, CT demonstrates evidence of both local progression and metastatic disease, with additional pathologic left subpectoral/axillary lymph nodes, left chest wall involvement, liver metastases, and questionable spine metastases. Evaluation of liver lesions would be best with contrast-enhanced liver MRI protocol, and PET-CT may be useful for evaluation of chest wall and presumed skeletal metastases. Surgical changes of left mastectomy and left axillary nodal dissection. Subcentimeter pulmonary nodules are indeterminate. Attention on future follow-up studies recommended. T10 compression fracture of indeterminate age. Given the appearance, a pathologic fracture is not favored. The left liver lobe is absent, either atrophic or secondary to a prior left hepatectomy. Fibroid uterus. Electronically Signed   By: Corrie Mckusick D.O.   On: 06/06/2018 14:01   Ct Abdomen Pelvis W Contrast  Result Date: 06/06/2018 CLINICAL DATA:  67 year old female with a history of left breast cancer with prior left modified radical mastectomy EXAM: CT CHEST, ABDOMEN, AND PELVIS WITH CONTRAST TECHNIQUE: Multidetector CT imaging of the chest, abdomen and pelvis was performed following the standard protocol during bolus administration of intravenous contrast. CONTRAST:  140mL OMNIPAQUE IOHEXOL 300 MG/ML  SOLN COMPARISON:  No prior CT imaging or PET imaging. Final pathology report from the surgical resection 04/19/2018 FINDINGS: CT CHEST FINDINGS Cardiovascular: Heart  size within normal size. No pericardial fluid/thickening. No significant calcifications of the coronary arteries. Unremarkable diameter of the pulmonary artery. Unremarkable course caliber and contour of the thoracic aorta. Minimal atherosclerotic changes of the aortic arch. Branch vessels are patent. Cervical cerebral arteries are patent at the base of the neck. Mediastinum/Nodes: Small lymph nodes of the mediastinum without adenopathy. Unremarkable course of the thoracic esophagus. Lungs/Pleura: No confluent airspace disease. No pneumothorax or pleural effusion. No central airway debris or thickening. There are tiny pulmonary nodules which are nonspecific including: -image 53 series 4 posterior left upper lobe -image 67 series 4, posterior left upper lobe -image 77 series 4 left upper lobe -image 100 series 4 peripheral right lower lobe Musculoskeletal: No acute displaced fracture. Chest wall: Surgical changes of left mastectomy and axillary nodal dissection. Internal enhancement and thickening of the left pectus minor muscle, with questionable thickening of intercostal musculature on image 16 of series 2. There are enlarged subpectoral lymph nodes of the left chest and left axillary nodes. Inflammatory changes of the left axilla adjacent to the surgical clips. CT ABDOMEN PELVIS FINDINGS Hepatobiliary: Left hepatic lobe is absent or atrophic. No surgical suture lines are present. There  are multiple subcentimeter hypodense lesions within the functional liver remnant/right liver lobe. Unremarkable gallbladder. Pancreas: Unremarkable pancreas. Spleen: Unremarkable spleen Adrenals/Urinary Tract: Bilateral adrenal glands unremarkable. Right kidney without hydronephrosis or nephrolithiasis. Small low-density lesion of the lateral right kidney cortex, likely a benign cyst. Left kidney without hydronephrosis or nephrolithiasis. Subcentimeter low-density lesion of the lateral left cortex, likely a benign cyst. Unremarkable  course the bilateral ureters. Unremarkable appearance of the urinary bladder. Stomach/Bowel: Hiatal hernia. Unremarkable stomach. Small bowel unremarkable. Normal appendix. Moderate to large stool burden with otherwise unremarkable colon. Vascular/Lymphatic: No significant vascular disease. Small lymph nodes of the preaortic and periaortic nodal station, none of which are enlarged. Reproductive: Fibroid uterus Other: None Musculoskeletal: Compression fracture of T10 with no comparison. Approximately 50% height loss in the mid vertebral body. Subcentimeter sclerotic foci of the T8, T9, T10, and L1 vertebral bodies. IMPRESSION: Given the available history, CT demonstrates evidence of both local progression and metastatic disease, with additional pathologic left subpectoral/axillary lymph nodes, left chest wall involvement, liver metastases, and questionable spine metastases. Evaluation of liver lesions would be best with contrast-enhanced liver MRI protocol, and PET-CT may be useful for evaluation of chest wall and presumed skeletal metastases. Surgical changes of left mastectomy and left axillary nodal dissection. Subcentimeter pulmonary nodules are indeterminate. Attention on future follow-up studies recommended. T10 compression fracture of indeterminate age. Given the appearance, a pathologic fracture is not favored. The left liver lobe is absent, either atrophic or secondary to a prior left hepatectomy. Fibroid uterus. Electronically Signed   By: Corrie Mckusick D.O.   On: 06/06/2018 14:01   Nm Pet Image Initial (pi) Skull Base To Thigh  Result Date: 06/17/2018 CLINICAL DATA:  Subsequent treatment strategy for LEFT breast cancer recurrence. EXAM: NUCLEAR MEDICINE PET SKULL BASE TO THIGH TECHNIQUE: 10.1 mCi F-18 FDG was injected intravenously. Full-ring PET imaging was performed from the skull base to thigh after the radiotracer. CT data was obtained and used for attenuation correction and anatomic localization.  Fasting blood glucose: 88 mg/dl COMPARISON:  CT 06/06/2018 FINDINGS: Mediastinal blood pool activity: SUV max 2.9 NECK: No hypermetabolic lymph nodes in the neck. Incidental CT findings: none CHEST: There is intense metabolic activity associated with the LEFT chest wall musculature. The musculature is thickened without discrete mass lesion. The activity intense with SUV max equal 12.2. There is a least 1 hypermetabolic lymph node in the LEFT axilla with SUV max equal 9.3. The hypermetabolic supraclavicular lymph nodes are small but intense with SUV max equal 6.9. Additionally there are hypermetabolic internal mammary nodes which are small Incidental CT findings: No suspicious pulmonary nodules. ABDOMEN/PELVIS: Multiple discrete foci of hypermetabolic activity in the liver consistent small liver metastasis. Example lesion LEFT hepatic lobe SUV max equal 8.4 on image 100. Query prior partial hepatectomy. Example lesion the more superior central LEFT hepatic lobe with SUV max equal 6.9. Approximately 15 lesions. Intensely hypermetabolic lymph nodes about the celiac trunk and SMA and porta hepatis. Example node in the porta hepatis with SUV max equal 19. Metabolic adenopathy extends along the aorta. Lymph nodes LEFT aorta with SUV max equal 23.3. No hypermetabolic pelvic lymph nodes. Uterus and adnexa normal. Incidental CT findings: none SKELETON: There multiple innumerable skeletal metastasis. These skeletal metastasis are hypermetabolic with subtle CT changes. Example lesion in the central sacrum with SUV max equal 19.6. Lesions throughout the spine and ribs. Example lesion at T10 with SUV max equal 21 Incidental CT findings: none IMPRESSION: 1. Evidence of local recurrence within the  LEFT chest wall musculature. 2. Evidence of metastatic adenopathy LEFT axilla, internal mammary nodes, and upper abdominal periaortic and periportal nodes. 3. Multiple hepatic metastasis. 4. Innumerable skeletal metastasis throughout the  bony pelvis, spine and ribs. Electronically Signed   By: Suzy Bouchard M.D.   On: 06/17/2018 16:57    Labs:  CBC: Recent Labs    04/15/18 1245 04/20/18 0551 05/17/18 0933 06/25/18 0720  WBC 2.5* 5.9 2.7* 3.6*  HGB 12.5 10.4* 12.4 13.3  HCT 40.6 33.1* 39.0 43.7  PLT 246 207 233 284    COAGS: Recent Labs    04/19/18 0743 06/25/18 0720  INR 1.03 1.02  APTT  --  23*    BMP: Recent Labs    03/27/18 1027 04/15/18 1245 04/20/18 0551 05/17/18 0933  NA 136 140 138 140  K 3.9 4.1 3.4* 4.2  CL 102 100 102 103  CO2 25 29 28 30   GLUCOSE 113* 101* 109* 95  BUN 13 11 <5* 13  CALCIUM 9.8 10.3 9.4 10.3  CREATININE 0.64 0.71 0.75 0.63  GFRNONAA >60 >60 >60 >60  GFRAA >60 >60 >60 >60    LIVER FUNCTION TESTS: Recent Labs    03/20/18 1313 03/22/18 1043 03/27/18 1027 05/17/18 0933  BILITOT 0.9 0.7 1.2 1.1  AST 21 18 22 20   ALT 30 26 37 20  ALKPHOS 78 77 67 68  PROT 7.2 7.2 7.2 7.3  ALBUMIN 4.1 4.1 4.1 4.2    TUMOR MARKERS: No results for input(s): AFPTM, CEA, CA199, CHROMGRNA in the last 8760 hours.  Assessment and Plan: Recurrent left breast cancer For port placement Labs ok Risks and benefits of image guided port-a-catheter placement was discussed with the patient including, but not limited to bleeding, infection, pneumothorax, or fibrin sheath development and need for additional procedures.  All of the patient's questions were answered, patient is agreeable to proceed. Consent signed and in chart.    Thank you for this interesting consult.  I greatly enjoyed meeting Brenda Richmond and look forward to participating in their care.  A copy of this report was sent to the requesting provider on this date.  Electronically Signed: Ascencion Dike, PA-C 06/25/2018, 8:57 AM   I spent a total of 20 minutes in face to face in clinical consultation, greater than 50% of which was counseling/coordinating care for port placement

## 2018-06-26 ENCOUNTER — Other Ambulatory Visit (HOSPITAL_COMMUNITY): Payer: Self-pay | Admitting: Hematology

## 2018-06-27 ENCOUNTER — Inpatient Hospital Stay (HOSPITAL_COMMUNITY): Payer: Managed Care, Other (non HMO)

## 2018-06-27 ENCOUNTER — Encounter (HOSPITAL_COMMUNITY): Payer: Self-pay

## 2018-06-27 VITALS — BP 124/73 | HR 75 | Temp 98.9°F | Resp 18 | Wt 196.4 lb

## 2018-06-27 DIAGNOSIS — Z171 Estrogen receptor negative status [ER-]: Principal | ICD-10-CM

## 2018-06-27 DIAGNOSIS — C50412 Malignant neoplasm of upper-outer quadrant of left female breast: Secondary | ICD-10-CM

## 2018-06-27 LAB — CBC WITH DIFFERENTIAL/PLATELET
Abs Immature Granulocytes: 0.05 10*3/uL (ref 0.00–0.07)
BASOS PCT: 0 %
Basophils Absolute: 0 10*3/uL (ref 0.0–0.1)
EOS ABS: 0.1 10*3/uL (ref 0.0–0.5)
EOS PCT: 2 %
HCT: 41 % (ref 36.0–46.0)
Hemoglobin: 12.5 g/dL (ref 12.0–15.0)
Immature Granulocytes: 1 %
Lymphocytes Relative: 10 %
Lymphs Abs: 0.4 10*3/uL — ABNORMAL LOW (ref 0.7–4.0)
MCH: 26.8 pg (ref 26.0–34.0)
MCHC: 30.5 g/dL (ref 30.0–36.0)
MCV: 87.8 fL (ref 80.0–100.0)
MONO ABS: 0.4 10*3/uL (ref 0.1–1.0)
MONOS PCT: 10 %
Neutro Abs: 3.1 10*3/uL (ref 1.7–7.7)
Neutrophils Relative %: 77 %
PLATELETS: 219 10*3/uL (ref 150–400)
RBC: 4.67 MIL/uL (ref 3.87–5.11)
RDW: 12.2 % (ref 11.5–15.5)
WBC: 4 10*3/uL (ref 4.0–10.5)
nRBC: 0 % (ref 0.0–0.2)

## 2018-06-27 LAB — COMPREHENSIVE METABOLIC PANEL
ALBUMIN: 4 g/dL (ref 3.5–5.0)
ALT: 50 U/L — AB (ref 0–44)
AST: 46 U/L — AB (ref 15–41)
Alkaline Phosphatase: 119 U/L (ref 38–126)
Anion gap: 10 (ref 5–15)
BUN: 10 mg/dL (ref 8–23)
CHLORIDE: 102 mmol/L (ref 98–111)
CO2: 27 mmol/L (ref 22–32)
CREATININE: 0.75 mg/dL (ref 0.44–1.00)
Calcium: 10.3 mg/dL (ref 8.9–10.3)
GFR calc non Af Amer: >60 mL/min (ref >60)
Glucose, Bld: 108 mg/dL — ABNORMAL HIGH (ref 70–99)
Potassium: 4.2 mmol/L (ref 3.5–5.1)
SODIUM: 139 mmol/L (ref 135–145)
Total Bilirubin: 1 mg/dL (ref 0.3–1.2)
Total Protein: 7.6 g/dL (ref 6.5–8.1)

## 2018-06-27 LAB — MAGNESIUM: MAGNESIUM: 2.1 mg/dL (ref 1.7–2.4)

## 2018-06-27 MED ORDER — SODIUM CHLORIDE 0.9 % IV SOLN
Freq: Once | INTRAVENOUS | Status: AC
  Administered 2018-06-27: 10:00:00 via INTRAVENOUS
  Filled 2018-06-27: qty 4

## 2018-06-27 MED ORDER — SODIUM CHLORIDE 0.9 % IV SOLN
840.0000 mg | Freq: Once | INTRAVENOUS | Status: AC
  Administered 2018-06-27: 840 mg via INTRAVENOUS
  Filled 2018-06-27: qty 14

## 2018-06-27 MED ORDER — SODIUM CHLORIDE 0.9% FLUSH
10.0000 mL | INTRAVENOUS | Status: DC | PRN
  Administered 2018-06-27: 10 mL
  Filled 2018-06-27: qty 10

## 2018-06-27 MED ORDER — HEPARIN SOD (PORK) LOCK FLUSH 100 UNIT/ML IV SOLN
500.0000 [IU] | Freq: Once | INTRAVENOUS | Status: AC | PRN
  Administered 2018-06-27: 500 [IU]

## 2018-06-27 MED ORDER — SODIUM CHLORIDE 0.9 % IV SOLN
Freq: Once | INTRAVENOUS | Status: AC
  Administered 2018-06-27: 10:00:00 via INTRAVENOUS

## 2018-06-27 MED ORDER — PACLITAXEL PROTEIN-BOUND CHEMO INJECTION 100 MG
100.0000 mg/m2 | Freq: Once | INTRAVENOUS | Status: AC
  Administered 2018-06-27: 200 mg via INTRAVENOUS
  Filled 2018-06-27: qty 40

## 2018-06-27 NOTE — Progress Notes (Signed)
Pt ambulated to room 405 for day 1 of new Chemo Treatment. Abraxane/ Atezolizumab. VSS. No complaints. Teaching performed. Pt folder given with teaching information. Consent for chemo treatment signed. Family member present during teaching with nursing background to help with understanding. Dr. Walden Field reviewed liver enzymes and VO received to proceed with pt's first treatment.

## 2018-06-27 NOTE — Patient Instructions (Signed)

## 2018-06-27 NOTE — Progress Notes (Signed)
Pt has doctor appt in [place] on October 24 with new Oncology doctor. Pt states, " I am to have my first chemo here and all other treatments in [place]. Pt instructed she is scheduled in our [place] for labs and chemo infusion on October 25. Schedule printed off for patient and teaching performed. Informed pt that there is a possibility that she will also have day 8 Cycle 1 due to first appointment with doctor in [place] on the 24th of October.   Treatment given today per MD orders. Tolerated infusion without adverse affects. Vital signs stable. No complaints at this time. Discharged from clinic ambulatory. F/U with [facility] as scheduled.

## 2018-06-28 ENCOUNTER — Telehealth (HOSPITAL_COMMUNITY): Payer: Self-pay

## 2018-06-28 NOTE — Telephone Encounter (Signed)
24 hour follow up call-patient stated she felt good. No issues today. Some mild back pain but no other issues at this time. Will see patient next week for treatment.

## 2018-07-05 ENCOUNTER — Inpatient Hospital Stay (HOSPITAL_COMMUNITY): Payer: Managed Care, Other (non HMO)

## 2018-07-05 ENCOUNTER — Ambulatory Visit (HOSPITAL_COMMUNITY): Payer: Managed Care, Other (non HMO)

## 2018-07-05 ENCOUNTER — Ambulatory Visit (HOSPITAL_COMMUNITY): Payer: Managed Care, Other (non HMO) | Admitting: Hematology

## 2018-07-12 ENCOUNTER — Ambulatory Visit (HOSPITAL_COMMUNITY): Payer: Managed Care, Other (non HMO)

## 2018-07-12 ENCOUNTER — Other Ambulatory Visit (HOSPITAL_COMMUNITY): Payer: Managed Care, Other (non HMO)

## 2018-07-19 ENCOUNTER — Ambulatory Visit (HOSPITAL_COMMUNITY): Payer: Managed Care, Other (non HMO)

## 2018-07-19 ENCOUNTER — Other Ambulatory Visit (HOSPITAL_COMMUNITY): Payer: Managed Care, Other (non HMO)

## 2018-08-11 DEATH — deceased

## 2018-11-29 IMAGING — CT CT CHEST W/ CM
2 of 5 series · 12 of 36 positions shown, 15 images · IV contrast (APPLIED)
Comparison: No prior CT imaging or PET imaging.

Final pathology report from the surgical resection 04/19/2018

CLINICAL DATA: 67-year-old female with a history of left breast
cancer with prior left modified radical mastectomy

EXAM:
CT CHEST, ABDOMEN, AND PELVIS WITH CONTRAST
TECHNIQUE: Multidetector CT imaging of the chest, abdomen and pelvis was
performed following the standard protocol during bolus
administration of intravenous contrast.
CONTRAST:  100mL OMNIPAQUE IOHEXOL 300 MG/ML  SOLN

[Series 2: cap with · axial · 0.77mm/px · z∈[+1232,+1732]mm · 9 of 126 slices shown, 12 images]
[im 13/126  mediastinal]
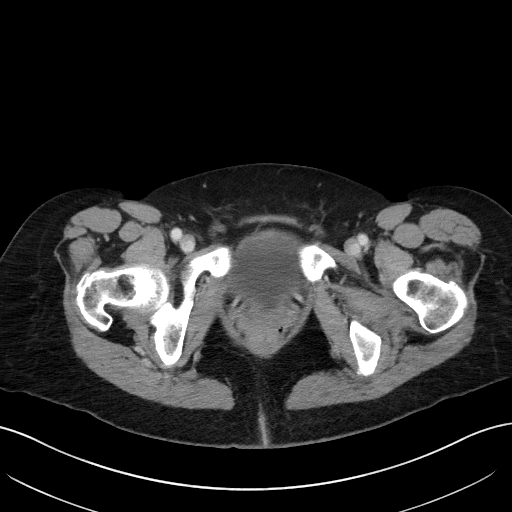
[im 13/126  lung]
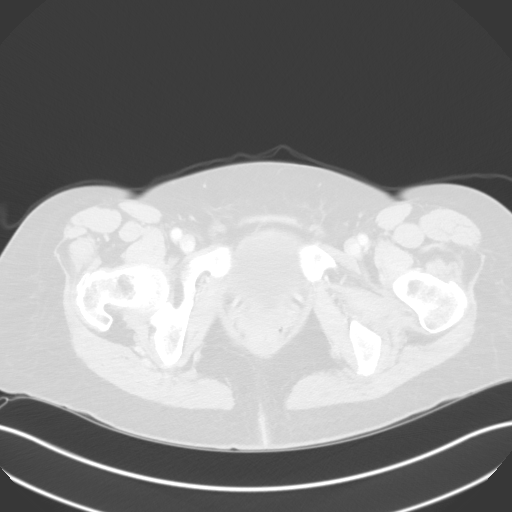
[im 26/126  lung]
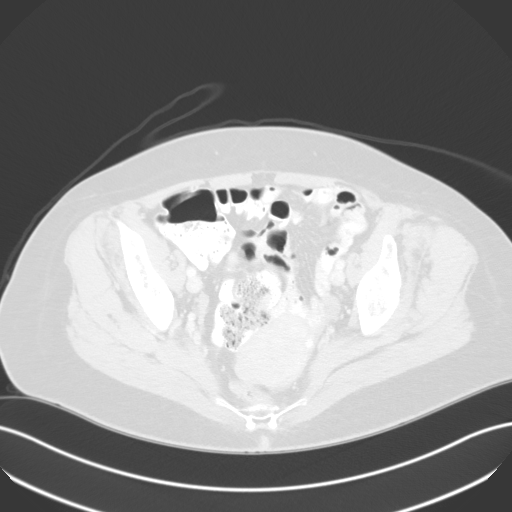
[im 38/126  lung]
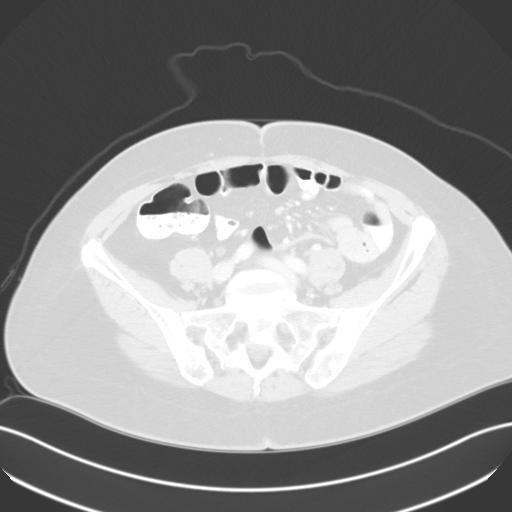
[im 51/126  lung]
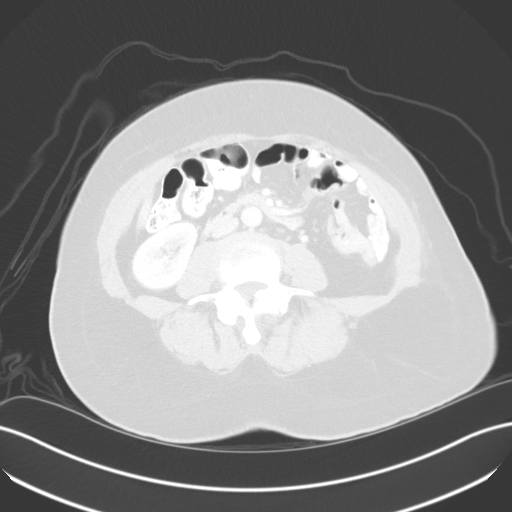
[im 63/126  mediastinal]
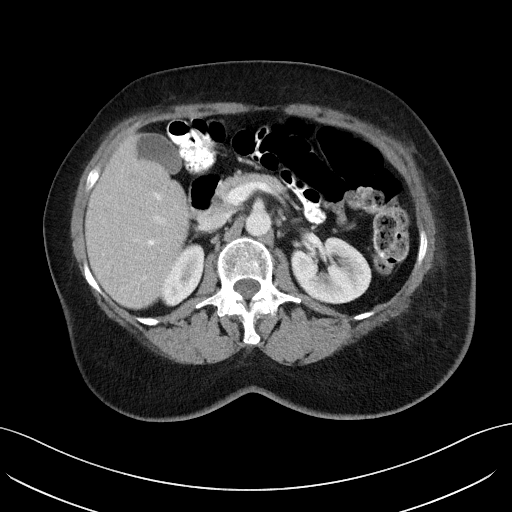
[im 63/126  lung]
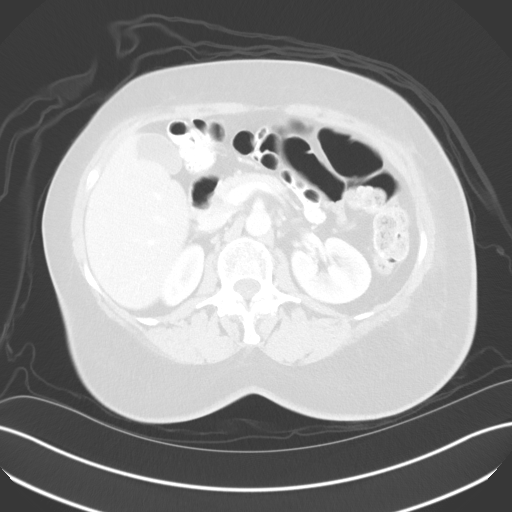
[im 76/126  lung]
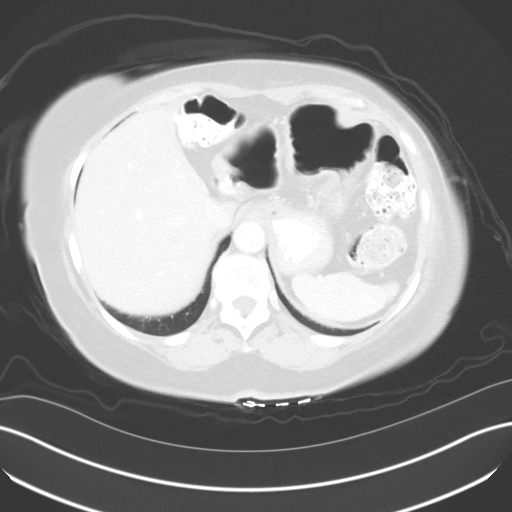
[im 88/126  lung]
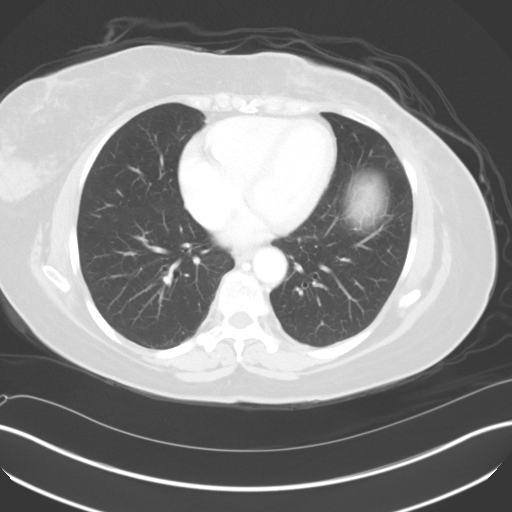
[im 101/126  lung]
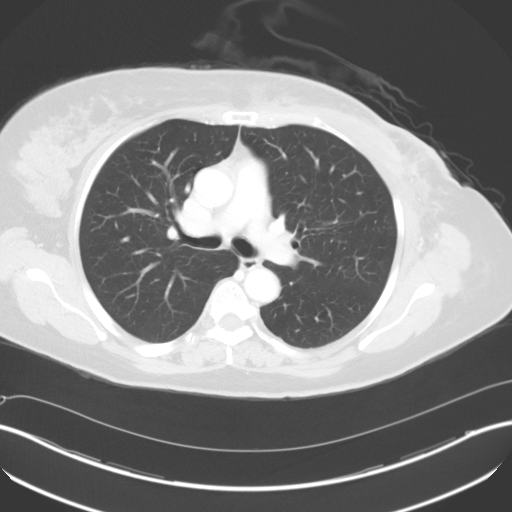
[im 113/126  mediastinal]
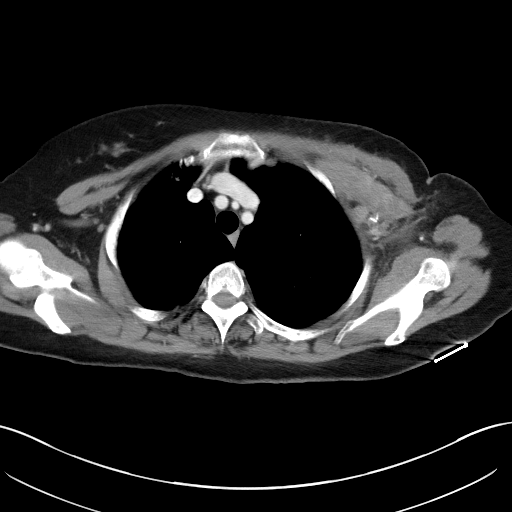
[im 113/126  lung]
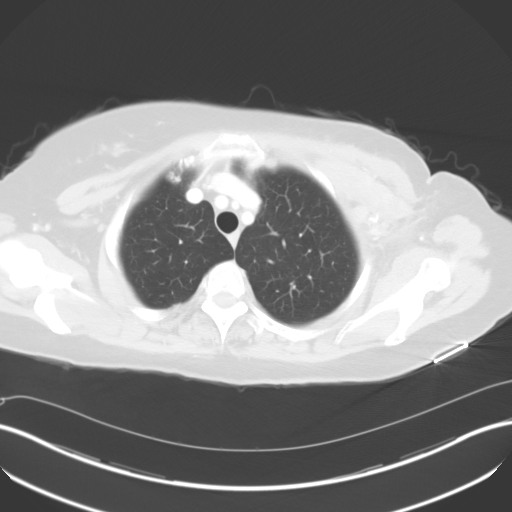

[Series 5: coronals · coronal · 0.82mm/px · 3 of 158 slices shown]
[im 32/158  lung]
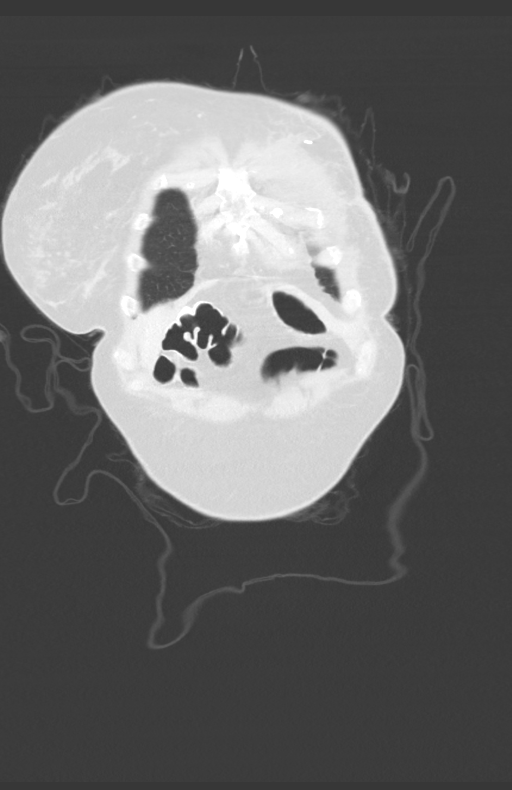
[im 63/158  lung]
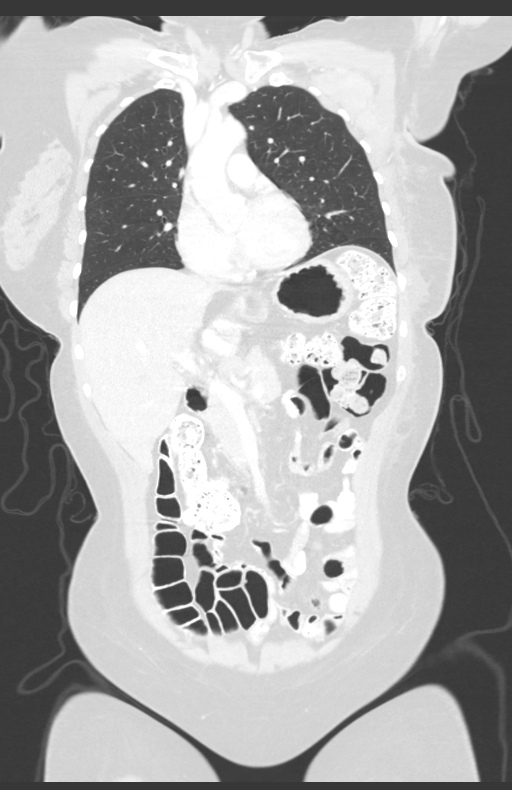
[im 95/158  lung]
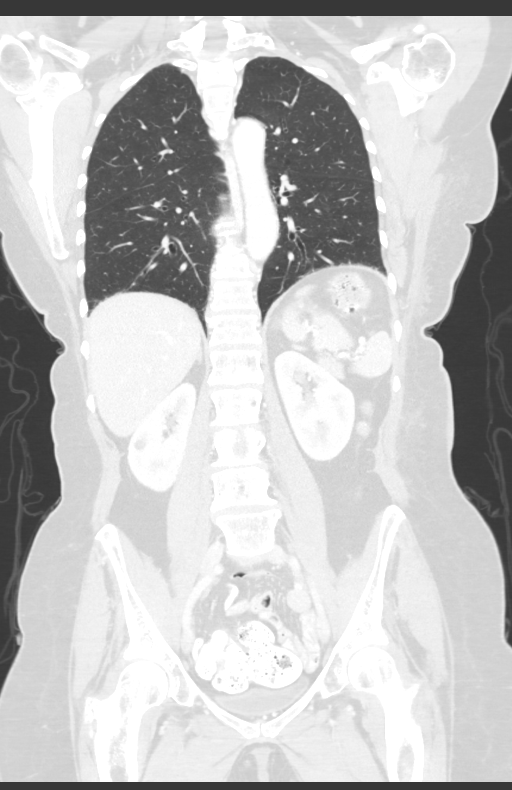

[12 of 36 positions shown; findings below may reference images not displayed]

FINDINGS: CT CHEST FINDINGS

Cardiovascular: Heart size within normal size. No pericardial
fluid/thickening. No significant calcifications of the coronary
arteries. Unremarkable diameter of the pulmonary artery.

Unremarkable course caliber and contour of the thoracic aorta.
Minimal atherosclerotic changes of the aortic arch. Branch vessels
are patent. Cervical cerebral arteries are patent at the base of the
neck.

Mediastinum/Nodes: Small lymph nodes of the mediastinum without
adenopathy. Unremarkable course of the thoracic esophagus.

Lungs/Pleura: No confluent airspace disease. No pneumothorax or
pleural effusion. No central airway debris or thickening.

There are tiny pulmonary nodules which are nonspecific including:

-image 53 series 4 posterior left upper lobe

-image 67 series 4, posterior left upper lobe

-image 77 series 4 left upper lobe

-image 100 series 4 peripheral right lower lobe

Musculoskeletal: No acute displaced fracture.

Chest wall:

Surgical changes of left mastectomy and axillary nodal dissection.

Internal enhancement and thickening of the left pectus minor muscle,
with questionable thickening of intercostal musculature on image 16
of series 2. There are enlarged subpectoral lymph nodes of the left
chest and left axillary nodes. Inflammatory changes of the left
axilla adjacent to the surgical clips.

CT ABDOMEN PELVIS FINDINGS

Hepatobiliary: Left hepatic lobe is absent or atrophic. No surgical
suture lines are present.

There are multiple subcentimeter hypodense lesions within the
functional liver remnant/right liver lobe.

Unremarkable gallbladder.

Pancreas: Unremarkable pancreas.

Spleen: Unremarkable spleen

Adrenals/Urinary Tract: Bilateral adrenal glands unremarkable.

Right kidney without hydronephrosis or nephrolithiasis. Small
low-density lesion of the lateral right kidney cortex, likely a
benign cyst.

Left kidney without hydronephrosis or nephrolithiasis. Subcentimeter
low-density lesion of the lateral left cortex, likely a benign cyst.

Unremarkable course the bilateral ureters.

Unremarkable appearance of the urinary bladder.

Stomach/Bowel: Hiatal hernia. Unremarkable stomach. Small bowel
unremarkable.

Normal appendix. Moderate to large stool burden with otherwise
unremarkable colon.

Vascular/Lymphatic: No significant vascular disease. Small lymph
nodes of the preaortic and periaortic nodal station, none of which
are enlarged.

Reproductive: Fibroid uterus

Other: None

Musculoskeletal: Compression fracture of T10 with no comparison.
Approximately 50% height loss in the mid vertebral body.

Subcentimeter sclerotic foci of the T8, T9, T10, and L1 vertebral
bodies.
IMPRESSION: Given the available history, CT demonstrates evidence of both local
progression and metastatic disease, with additional pathologic left
subpectoral/axillary lymph nodes, left chest wall involvement, liver
metastases, and questionable spine metastases. Evaluation of liver
lesions would be best with contrast-enhanced liver MRI protocol, and
PET-CT may be useful for evaluation of chest wall and presumed
skeletal metastases.

Surgical changes of left mastectomy and left axillary nodal
dissection.

Subcentimeter pulmonary nodules are indeterminate. Attention on
future follow-up studies recommended.

T10 compression fracture of indeterminate age. Given the appearance,
a pathologic fracture is not favored.

The left liver lobe is absent, either atrophic or secondary to a
prior left hepatectomy.

Fibroid uterus.

## 2018-12-18 IMAGING — US IR FLUORO GUIDE CV LINE*L*
1 series · 3 of 3 positions shown · non-contrast
Comparison: none

CLINICAL DATA: Recurrent metastatic left breast carcinoma and need
for porta cath for chemotherapy.

[Series 1: ir fluoro guide cv line*left* · 3 of 3 slices shown]
[im 1/3]
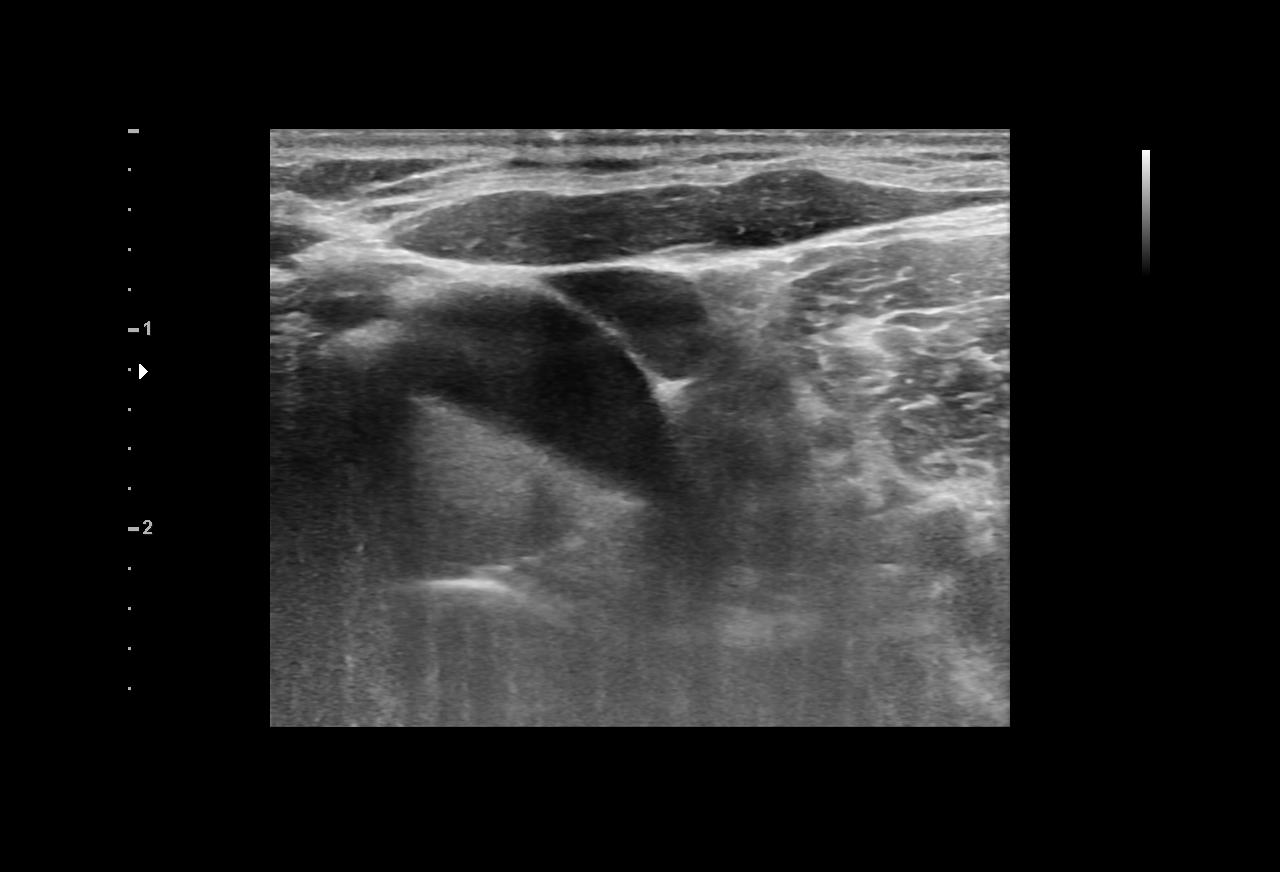
[im 2/3]
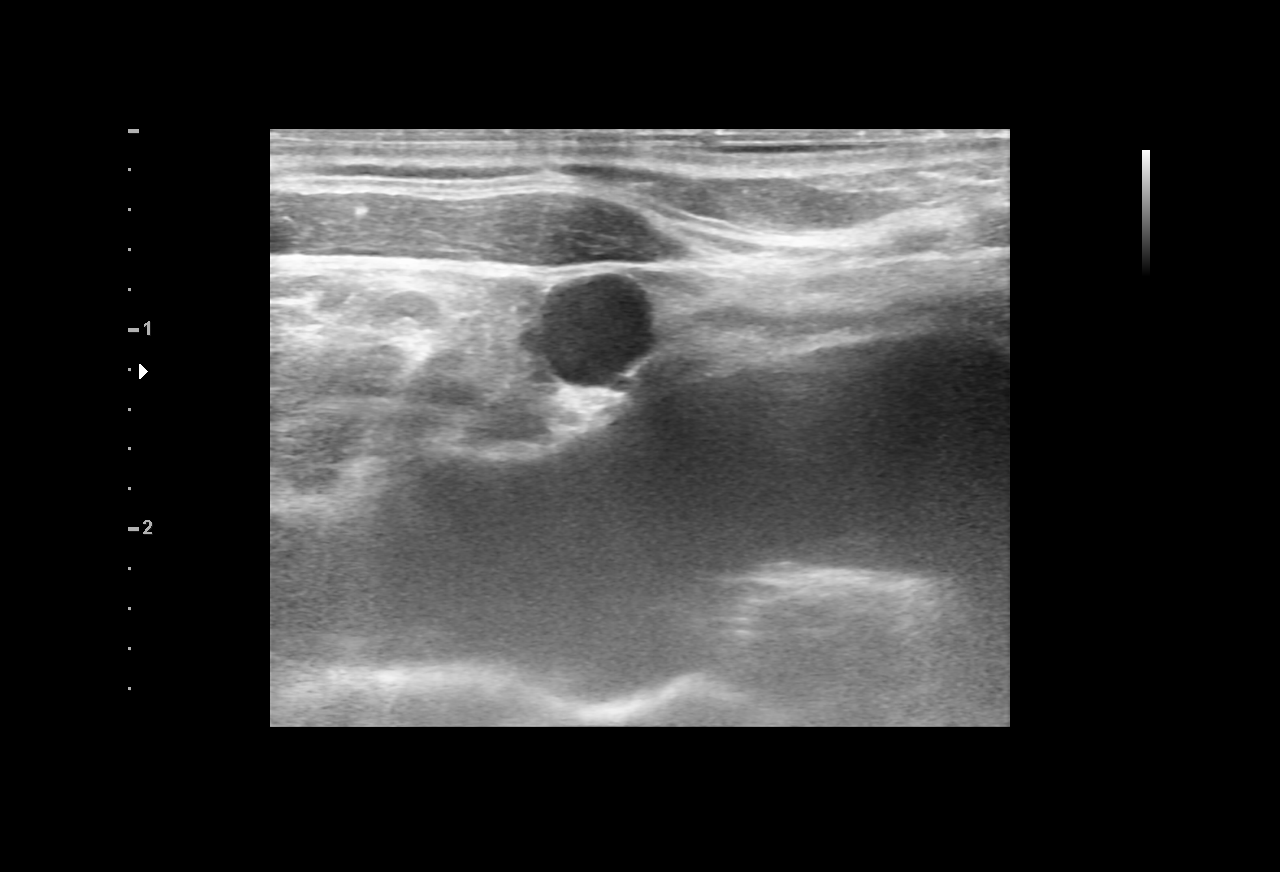
[im 3/3]
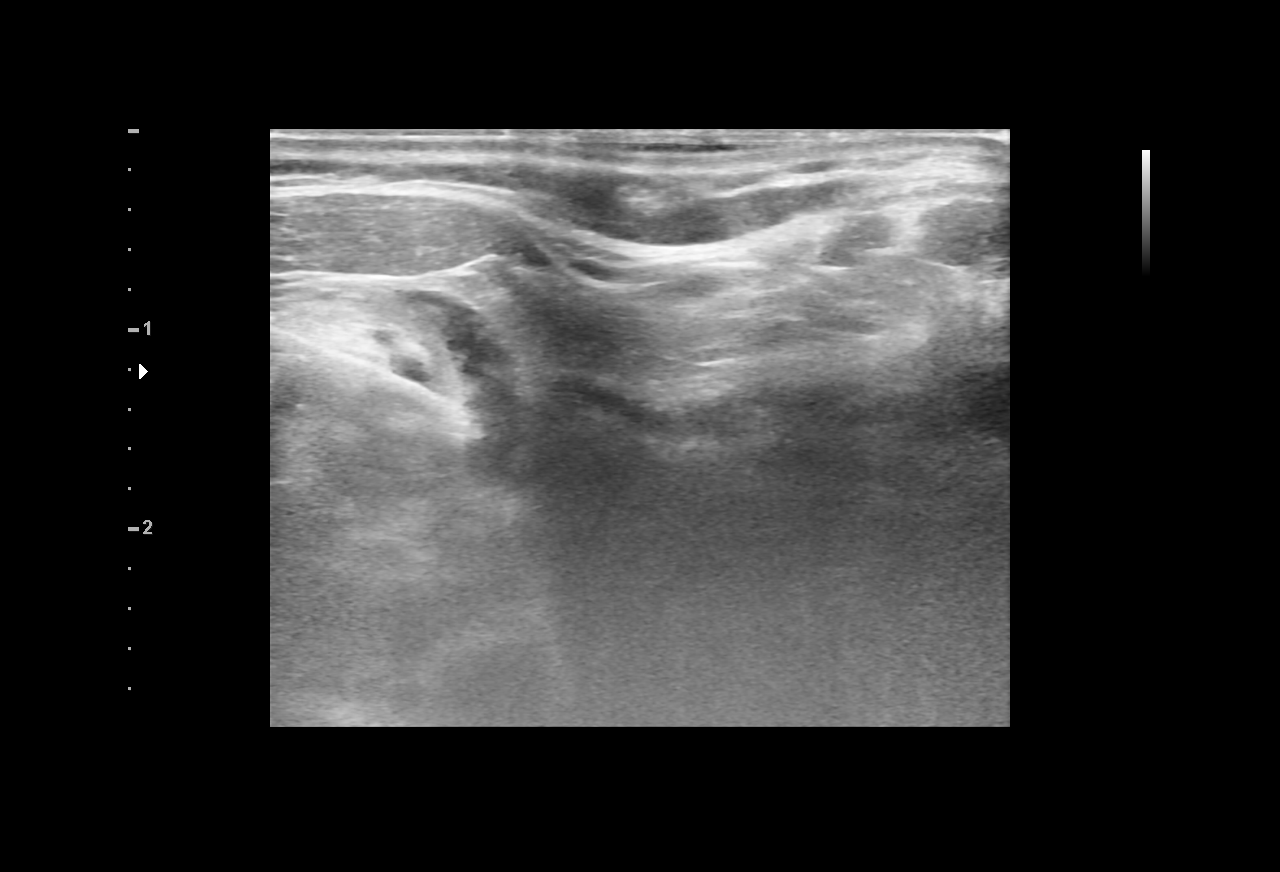

[3 of 3 positions shown; findings below may reference images not displayed]

EXAM:
IMPLANTED PORT A CATH PLACEMENT WITH ULTRASOUND AND FLUOROSCOPIC
GUIDANCE

ANESTHESIA/SEDATION:
1.0 mg IV Versed; 75 mcg IV Fentanyl

Total Moderate Sedation Time:  30 minutes

The patient's level of consciousness and physiologic status were
continuously monitored during the procedure by Radiology nursing.

Additional Medications: 1 g IV vancomycin. Vancomycin was given
within two hours of incision. Vancomycin was given due to an
antibiotic allergy.

FLUOROSCOPY TIME:  18 seconds.  0.7 mGy.

PROCEDURE:
The procedure, risks, benefits, and alternatives were explained to
the patient. Questions regarding the procedure were encouraged and
answered. The patient understands and consents to the procedure. A
time-out was performed prior to initiating the procedure.

Ultrasound was utilized to confirm patency of the right internal
jugular vein. The right neck and chest were prepped with
chlorhexidine in a sterile fashion, and a sterile drape was applied
covering the operative field. Maximum barrier sterile technique with
sterile gowns and gloves were used for the procedure. Local
anesthesia was provided with 1% lidocaine.

After creating a small venotomy incision, a 21 gauge needle was
advanced into the right internal jugular vein under direct,
real-time ultrasound guidance. Ultrasound image documentation was
performed. After securing guidewire access, an 8 Fr dilator was
placed. A J-wire was kinked to measure appropriate catheter length.

A subcutaneous port pocket was then created along the upper chest
wall utilizing sharp and blunt dissection. Portable cautery was
utilized. The pocket was irrigated with sterile saline.

A single lumen power injectable port was chosen for placement. The 8
Fr catheter was tunneled from the port pocket site to the venotomy
incision. The port was placed in the pocket. External catheter was
trimmed to appropriate length based on guidewire measurement.

At the venotomy, an 8 Fr peel-away sheath was placed over a
guidewire. The catheter was then placed through the sheath and the
sheath removed. Final catheter positioning was confirmed and
documented with a fluoroscopic spot image. The port was accessed
with a needle and aspirated and flushed with heparinized saline. The
access needle was removed.

The venotomy and port pocket incisions were closed with subcutaneous
3-0 Monocryl and subcuticular 4-0 Vicryl. Dermabond was applied to
both incisions.

COMPLICATIONS:
COMPLICATIONS
None
FINDINGS: After catheter placement, the tip lies at the Gohari junction.
The catheter aspirates normally and is ready for immediate use.
IMPRESSION: Placement of single lumen port a cath via right internal jugular
vein. The catheter tip lies at the Gohari junction. A power
injectable port a cath was placed and is ready for immediate use.
